# Patient Record
Sex: Female | Born: 1985 | Race: Black or African American | Hispanic: No | Marital: Single | Smoking: Never smoker
Health system: Southern US, Community
[De-identification: ages and names within clinical notes are randomized; demographics above are authoritative.]

---

## 2008-07-14 ENCOUNTER — Inpatient Hospital Stay (HOSPITAL_COMMUNITY): Admission: AD | Admit: 2008-07-14 | Discharge: 2008-07-14 | Payer: Self-pay | Admitting: Obstetrics and Gynecology

## 2008-07-14 ENCOUNTER — Inpatient Hospital Stay (HOSPITAL_COMMUNITY): Admission: AD | Admit: 2008-07-14 | Discharge: 2008-07-18 | Payer: Self-pay | Admitting: Obstetrics and Gynecology

## 2008-07-15 ENCOUNTER — Encounter (INDEPENDENT_AMBULATORY_CARE_PROVIDER_SITE_OTHER): Payer: Self-pay | Admitting: Obstetrics and Gynecology

## 2008-07-15 DIAGNOSIS — O4290 Premature rupture of membranes, unspecified as to length of time between rupture and onset of labor, unspecified weeks of gestation: Secondary | ICD-10-CM

## 2009-05-23 ENCOUNTER — Emergency Department (HOSPITAL_COMMUNITY): Admission: EM | Admit: 2009-05-23 | Discharge: 2009-05-23 | Payer: Self-pay | Admitting: Emergency Medicine

## 2009-05-31 ENCOUNTER — Emergency Department (HOSPITAL_COMMUNITY): Admission: EM | Admit: 2009-05-31 | Discharge: 2009-06-01 | Payer: Self-pay | Admitting: Emergency Medicine

## 2010-05-03 ENCOUNTER — Emergency Department (HOSPITAL_COMMUNITY): Admission: EM | Admit: 2010-05-03 | Discharge: 2010-05-03 | Payer: Self-pay | Admitting: Family Medicine

## 2011-03-06 LAB — POCT PREGNANCY, URINE: Preg Test, Ur: NEGATIVE

## 2011-04-11 NOTE — Op Note (Signed)
NAME:  Krista Rice, Krista Rice NO.:  [id number]   MEDICAL RECORD NO.:  [id number]          PATIENT TYPE:  INP   LOCATION:  9141                          FACILITY:  [facility]   PHYSICIAN:  Sherron Monday, MD        DATE OF BIRTH:  Apr 21, 1986   DATE OF PROCEDURE:  07/15/2008  DATE OF DISCHARGE:                               OPERATIVE REPORT   PREOPERATIVE DIAGNOSIS:  Completely dilated, breech presentation.   POSTOPERATIVE DIAGNOSES:  Completely dilated, breech presentation, and  delivered via low-transverse cesarean section.   PROCEDURE:  Primary low-transverse cesarean section.   SURGEON:  Sherron Monday, MD   ANESTHESIA:  Epidural with 20 mL of 1% lidocaine local anesthetic.   FINDINGS:  Viable female infant at 12:34 a.m. with Apgars of 8 at 1  minute and 9 at 5 minutes and a weight of 5 pounds.  Placenta was  expressed intact.  Normal uterus, tubes, and ovaries noted.  Cord pH of  7.18.   COMPLICATIONS:  None.   PATHOLOGY:  None.   ESTIMATED BLOOD LOSS:  700 mL   IV FLUIDS:  2500 mL.   URINE OUTPUT:  25 mL clear urine at the end of the procedure.   DISPOSITION:  Stable to PACU.   DESCRIPTION OF PROCEDURE:  There was a 5-minute bradycardia of 50-60  beats per minute for fetal heart rate.  Directly following this,  complete dilitation was verified and pushing began, meconium was noted  and a breech presentation.  At this point, decision was made to proceed  with a stat cesarean section.  I spoke with the patient briefly risks,  benefits, and alternatives of a low-transverse cesarean section and  proceeded to the operating room where she was placed on the table in  supine position with a leftward tilt and her epidural was dosed and  found to be adequate.  She was then prepped and draped in the normal  sterile fashion.  A Pfannenstiel skin incision was made approximately  two fingerbreadths above the pubic symphysis carried through the  underlying layer of fascia  sharply.  The fascia was incised in the  midline and the incision was extended laterally with Mayo scissors.  The  inferior aspect of the fascial incision was grasped with Kocher clamps,  elevating the rectus muscles were dissected off both bluntly and  sharply.  Superior aspect of the fascial incision was grasped with  Kocher clamps and elevating the rectus muscles were dissected off  bluntly.  Midline was easily identified and the peritoneum was entered  bluntly.  The Alexis skin retractor was placed and carefully checked to  make sure that no bowel was entrapped.  The vesicouterine peritoneum was  identified and transverse incision was made on the uterus, and the  infant was delivered from a breech presentation.  The uterus was cleared  of all clots and debris.  The uterine incision was closed with 2 layers  of 0 Monocryl, the first one which is a running locked and the second is  an imbricating.  Irrigation was performed of  the pelvis and hemostasis  of the incision was assured.  The subfascial planes were inspected and  found to be hemostatic.  The fascia was then closed with 0 Vicryl in a  running fashion, subcuticular adipose layer was made hemostatic with  Bovie cautery.  Irrigation was performed and the tissue was  reapproximated using 2-0 plain gut.  The skin was closed with staples.  The patient tolerated the procedure well.  Sponge, lap, and needle  counts were correct x2 at the end of the procedure.  She was taken in  stable condition to the PACU.      Sherron Monday, MD  Electronically Signed     JB/MEDQ  D:  07/15/2008  T:  07/15/2008  Job:  [id number]
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
## 2018-09-25 ENCOUNTER — Ambulatory Visit: Payer: Self-pay | Admitting: Family Medicine

## 2018-09-25 VITALS — BP 124/74 | HR 89 | Temp 98.0°F | Resp 14 | Ht 70.0 in | Wt 313.0 lb

## 2018-09-25 DIAGNOSIS — R208 Other disturbances of skin sensation: Secondary | ICD-10-CM

## 2018-09-25 DIAGNOSIS — R202 Paresthesia of skin: Secondary | ICD-10-CM

## 2018-09-25 NOTE — Progress Notes (Signed)
Subjective: right 4th finger decreased sensation      Krista Rice is a 32 y.o. female who presents with hand paresthesias. Onset of the symptoms was 2 days ago.  For the last 2 days she has had tingling in the distal fingers of her right hand, which has transitioned into decreased sensation in her distal fourth right finger only now.  Tingling resolved.  Denies any other symptoms or concerns.  Aggravating factors: none known. Symptoms have gradually improved. Evaluation to date: none. Treatment to date: none.  Denies any medical history, trauma, injury, or surgery anywhere.  Denies neck pain, back pain, right shoulder pain, arm pain, elbow pain, wrist pain, hand pain, or pain anywhere.  Reports normal range of motion, strength, and motor function everywhere.  Denies any symptoms of neurovascular abnormality.  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  Patient reports recently having a physical with her primary care provider, where her lab work was normal and negative for diabetes.  Patient denies any other symptoms of numbness, tingling, or paresthesias.  Denies neurologic symptoms.  Denies any other symptoms or concerns.    Review of Systems Pertinent items noted in HPI and remainder of comprehensive ROS otherwise negative.    Objective:    BP 124/74 (BP Location: Left Arm)   Pulse 89   Temp 98 F (36.7 C) (Oral)   Resp 14   Ht 5\' 10"  (1.778 m)   Wt (!) 313 lb (142 kg)   LMP 09/12/2018 (Within Days)   SpO2 99%   BMI 44.91 kg/m  Right wrist:  Neurovascular exam is normal. No thenar or hypothenar atrophy. Phalen's: positive. Sensation diminished on the distal aspect of 4th finger, beyond DIP only. Sensation otherwise normal everywhere else. Strength normal. Swelling is not present. Synovitis is not present. Tenderness is present. Tinel's sign: positive. Wrist/hand has normal range of motion and motor function.  No erythema, edema, ecchymosis, warmth to the touch, crepitus, or  deformity noted.  Normal to inspection and palpation.  Left wrist:  normal   General appearance: alert, cooperative, appears stated age and no distress Neck: Normal range of motion, motor function, strength, and sensation.  No TTP noted.  No midline tenderness or deformity.  Normal alignment and curvature.  Normal to inspection and palpation.  Extremities: extremities normal, atraumatic, no cyanosis or edema.  Normal exam of right shoulder, arm, elbow, and forearm. Pulses: 2+ and symmetric Skin: Skin color, texture, turgor normal. No rashes or lesions   Assessment:   Decreased sensation to right fourth finger- possibly very early carpal tunnel syndrome, unclear at this point given the short duration of symptoms.   Plan:     Discussed the differential diagnosis with the patient and all questions answered.   Provided patient with written instructions to take an OTC NSAID PRN.  Discussed the safest way to take medication is for the shortest duration and at lowest dose needed.  Discussed side/adverse effects.  Patient denies any contraindications to NSAID use.  Advised her to take with food and drink an adequate amount of water. Discussed the appropriate application of ice/heat.  Encouraged range of motion exercises and demonstrated these.  Discussed proper ergonomics.  Did not have the appropriate splint in the office that fit the patient, provided her with instructions on the appropriate splint to buy over-the-counter.  Applied an Ace wrap in the meantime.   Discussed that this treatment is a trial to see if it improves/resolves her symptoms  but that there is uncertainty of the diagnosis given short duration of symptoms/improving symptoms. Advised patient that if her symptoms do not improve in the next 2-3 weeks, worsen, or she develops any new symptoms that she needs to follow-up with her primary care provider. Red flag symptoms and indications to seek medical care discussed.

## 2019-01-13 NOTE — Patient Instructions (Addendum)
Nexplanon Instructions After Insertion   Keep bandage clean and dry for 24 hours   May use ice/Tylenol/Ibuprofen for soreness or pain   If you develop fever, drainage or increased warmth from incision site-contact office immediately    Preventive Care 18-39 Years, Female Preventive care refers to lifestyle choices and visits with your health care provider that can promote health and wellness. What does preventive care include?   A yearly physical exam. This is also called an annual well check.  Dental exams once or twice a year.  Routine eye exams. Ask your health care provider how often you should have your eyes checked.  Personal lifestyle choices, including: ? Daily care of your teeth and gums. ? Regular physical activity. ? Eating a healthy diet. ? Avoiding tobacco and drug use. ? Limiting alcohol use. ? Practicing safe sex. ? Taking vitamin and mineral supplements as recommended by your health care provider. What happens during an annual well check? The services and screenings done by your health care provider during your annual well check will depend on your age, overall health, lifestyle risk factors, and family history of disease. Counseling Your health care provider may ask you questions about your:  Alcohol use.  Tobacco use.  Drug use.  Emotional well-being.  Home and relationship well-being.  Sexual activity.  Eating habits.  Work and work environment.  Method of birth control.  Menstrual cycle.  Pregnancy history. Screening You may have the following tests or measurements:  Height, weight, and BMI.  Diabetes screening. This is done by checking your blood sugar (glucose) after you have not eaten for a while (fasting).  Blood pressure.  Lipid and cholesterol levels. These may be checked every 5 years starting at age 20.  Skin check.  Hepatitis C blood test.  Hepatitis B blood test.  Sexually transmitted disease (STD)  testing.  BRCA-related cancer screening. This may be done if you have a family history of breast, ovarian, tubal, or peritoneal cancers.  Pelvic exam and Pap test. This may be done every 3 years starting at age 21. Starting at age 30, this may be done every 5 years if you have a Pap test in combination with an HPV test. Discuss your test results, treatment options, and if necessary, the need for more tests with your health care provider. Vaccines Your health care provider may recommend certain vaccines, such as:  Influenza vaccine. This is recommended every year.  Tetanus, diphtheria, and acellular pertussis (Tdap, Td) vaccine. You may need a Td booster every 10 years.  Varicella vaccine. You may need this if you have not been vaccinated.  HPV vaccine. If you are 26 or younger, you may need three doses over 6 months.  Measles, mumps, and rubella (MMR) vaccine. You may need at least one dose of MMR. You may also need a second dose.  Pneumococcal 13-valent conjugate (PCV13) vaccine. You may need this if you have certain conditions and were not previously vaccinated.  Pneumococcal polysaccharide (PPSV23) vaccine. You may need one or two doses if you smoke cigarettes or if you have certain conditions.  Meningococcal vaccine. One dose is recommended if you are age 19-21 years and a first-year college student living in a residence hall, or if you have one of several medical conditions. You may also need additional booster doses.  Hepatitis A vaccine. You may need this if you have certain conditions or if you travel or work in places where you may be exposed to hepatitis A.    Hepatitis B vaccine. You may need this if you have certain conditions or if you travel or work in places where you may be exposed to hepatitis B.  Haemophilus influenzae type b (Hib) vaccine. You may need this if you have certain risk factors. Talk to your health care provider about which screenings and vaccines you need  and how often you need them. This information is not intended to replace advice given to you by your health care provider. Make sure you discuss any questions you have with your health care provider. Document Released: 01/09/2002 Document Revised: 06/26/2017 Document Reviewed: 09/14/2015 Elsevier Interactive Patient Education  2019 Elsevier Inc.  

## 2019-01-14 ENCOUNTER — Encounter: Payer: Self-pay | Admitting: Certified Nurse Midwife

## 2019-01-14 ENCOUNTER — Ambulatory Visit (INDEPENDENT_AMBULATORY_CARE_PROVIDER_SITE_OTHER): Payer: Managed Care, Other (non HMO) | Admitting: Certified Nurse Midwife

## 2019-01-14 VITALS — BP 124/97 | HR 79 | Ht 70.0 in | Wt 319.5 lb

## 2019-01-14 DIAGNOSIS — Z3049 Encounter for surveillance of other contraceptives: Secondary | ICD-10-CM

## 2019-01-14 DIAGNOSIS — Z3046 Encounter for surveillance of implantable subdermal contraceptive: Secondary | ICD-10-CM

## 2019-01-14 DIAGNOSIS — Z30017 Encounter for initial prescription of implantable subdermal contraceptive: Secondary | ICD-10-CM

## 2019-01-14 NOTE — Progress Notes (Signed)
Krista Rice is a 33 y.o. year old G17P0101 African American female here for Nexplanon removal and reinsertion.  She was given informed consent for removal and reinsertion of her Nexplanon. Her Nexplanon was placed in February 2017.   Risks/benefits/side effects of Nexplanon have been discussed and her questions have been answered.  Specifically, a failure rate of 11/998 has been reported, with an increased failure rate if pt takes St. John's Wort and/or antiseizure medicaitons.  [name] is aware of the common side effect of irregular bleeding, which the incidence of decreases over time.  BP (!) 124/97   Pulse 79   Ht 5\' 10"  (1.778 m)   Wt (!) 319 lb 8 oz (144.9 kg)   LMP 12/16/2018 (Within Days)   BMI 45.84 kg/m   Appropriate time out taken. Nexplanon site identified.  Area prepped in usual sterile fashon. Two cc's of 2% lidocaine was used to anesthetize the area. A small stab incision was made right beside the implant on the distal portion.  The Nexplanon rod was grasped using hemostats and removed intact without difficulty.  The area was cleansed again with betadine and the Nexplanon was inserted per manufacturer's recommendations without difficulty.  Steri-strips and a pressure bandage was applied.  There was less than 3 cc blood loss. There were no complications.  The patient tolerated the procedure well.  She was instructed to keep the area clean and dry, remove pressure bandage in 24 hours, and keep insertion site covered with the steri-strips for 3-5 days.  She was given a card indicating date Nexplanon was inserted and date it needs to be removed.   Reviewed red flag symptoms and when to call.   RTC x 4 months for ANNUAL EXAM or sooner if needed.    Gunnar Bulla, CNM [facility], [facility] 01/14/19 9:23 AM   NDC: 9379-0240-97 Exp: 04/2021 Lot: [id number]

## 2019-01-14 NOTE — Progress Notes (Signed)
Pt is present today for nexplanon removal and insertion. Pt stated that she is doing well. No problems or concerns to report.

## 2019-06-27 ENCOUNTER — Ambulatory Visit: Payer: Managed Care, Other (non HMO) | Admitting: Adult Health

## 2019-06-27 ENCOUNTER — Other Ambulatory Visit: Payer: Self-pay

## 2019-06-27 ENCOUNTER — Encounter: Payer: Self-pay | Admitting: Adult Health

## 2019-06-27 VITALS — BP 130/86 | HR 93 | Temp 98.0°F | Resp 18 | Ht 71.0 in | Wt 339.0 lb

## 2019-06-27 DIAGNOSIS — Z008 Encounter for other general examination: Secondary | ICD-10-CM

## 2019-06-27 DIAGNOSIS — Z0189 Encounter for other specified special examinations: Secondary | ICD-10-CM | POA: Diagnosis not present

## 2019-06-27 DIAGNOSIS — Z6841 Body Mass Index (BMI) 40.0 and over, adult: Secondary | ICD-10-CM

## 2019-06-27 NOTE — Progress Notes (Signed)
[facility]  Krista Rice DOB: 33 y.o. MRN: [id number]  Subjective:  Here for Biometric Screen/brief exam Patient is a 33 year old female in no acute distress who comes to the clinic for a brief biometric exam and screening.   She reports she use to see a nutritionist in [place] and no longer does. She feels she did better with her weight when she received nutritional counseling.  She reports she has gained weight and that she is not eating good foods. She desires to see nutritional services again.  Denies leg swelling or cramping.  She reports she sees a primary care provider in [place] for yearly physical and female health maintenance.  Patient  denies any fever, body aches,chills, rash, chest pain, shortness of breath, nausea, vomiting, or diarrhea.   Objective: Blood pressure 130/86, pulse 93, temperature 98 F (36.7 C), temperature source Temporal, resp. rate 18, height 5\' 11"  (1.803 m), weight (!) 339 lb (153.8 kg), SpO2 98 %. Body mass index is 47.28 kg/m. NAD, obese, well nourished, well developed  HEENT: Within normal limits Neck: Normal Heart: Regular rate and rhythm Lungs: Clear to auscultation without adventitious lung sounds.   Assessment: Biometric screen 1. Severe obesity (BMI >= 40) (HCC)   2. Encounter for biometric screening   3. Encounter for other general examination- brief biometric exam with biometric screening    4. Class 3 severe obesity due to excess calories with body mass index (BMI) of 45.0 to 49.9 in adult, unspecified whether serious comorbidity present [facility])      Plan: Orders Placed This Encounter  Procedures  . Ambulatory referral to Nutrition and Diabetic Education   Call if you have not heard from nutritional services within one week.  Fasting glucose and lipids. Discussed with patient that today's visit here is a limited biometric screening  visit (not a comprehensive exam or management of any chronic problems) Discussed some health issues, including healthy eating habits and exercise. Encouraged to follow-up with PCP for annual comprehensive preventive and wellness care (and if applicable, any chronic issues). Questions invited and answered.   Follow up with primary care as needed for chronic and maintenance health care- can be seen in this employee clinic for acute care.

## 2019-06-27 NOTE — Patient Instructions (Signed)
The Biometric exam is a brief physical with labs including glucose and cholesterol. This does not replace a full physical with a primary care provider, and additional recommended labs. This is an acute care clinic not for maintenance of chronic or long standing conditions.   Provider also recommends if you do not have a primary care provider for patient to establish care promptly.You can choose any provider of your choice at any facility of your choice, the below information is  just a resource to aid in you finding a primary care provider for routine health maintenance.   [facility]  PHYSICIAN/PROVIDER  REFERRAL LINE at [phone]  [email or web address] to help assist with finding a primary care doctor.   Helpful resources below of other primary care office's accepting new patients.   Lovie Macadamia. [facility]         [address]  [place]. [place] [address] [phone]  . [facility]    [address], [address] [place], [place] [address] [phone]  . [facility] [address]  [place], [place]  [phone]   . [facility]  [address], [address] [place] [address] [phone]    Follow up with primary care as needed for chronic and maintenance health care- can be seen in this employee clinic for acute care.     Heart-Healthy Eating Plan Heart-healthy meal planning includes:  Eating less unhealthy fats.  Eating more healthy fats.  Making other changes in your diet. Talk with your doctor or a diet specialist (dietitian) to create an eating plan that is right for you. What is my plan? Your doctor may recommend an eating plan that includes:  Total fat: ______% or less of total calories a day.  Saturated fat: ______% or less of total calories a day.  Cholesterol: less than _________mg a day. What are tips for following this plan? Cooking Avoid frying your  food. Try to bake, boil, grill, or broil it instead. You can also reduce fat by:  Removing the skin from poultry.  Removing all visible fats from meats.  Steaming vegetables in water or broth. Meal planning   At meals, divide your plate into four equal parts: ? Fill one-half of your plate with vegetables and green salads. ? Fill one-fourth of your plate with whole grains. ? Fill one-fourth of your plate with lean protein foods.  Eat 4-5 servings of vegetables per day. A serving of vegetables is: ? 1 cup of raw or cooked vegetables. ? 2 cups of raw leafy greens.  Eat 4-5 servings of fruit per day. A serving of fruit is: ? 1 medium whole fruit. ?  cup of dried fruit. ?  cup of fresh, frozen, or canned fruit. ?  cup of 100% fruit juice.  Eat more foods that have soluble fiber. These are apples, broccoli, carrots, beans, peas, and barley. Try to get 20-30 g of fiber per day.  Eat 4-5 servings of nuts, legumes, and seeds per week: ? 1 serving of dried beans or legumes equals  cup after being cooked. ? 1 serving of nuts is  cup. ? 1 serving of seeds equals 1 tablespoon. General information  Eat more home-cooked food. Eat less restaurant, buffet, and fast food.  Limit or avoid alcohol.  Limit foods that are high in starch and sugar.  Avoid fried foods.  Lose weight if you are overweight.  Keep track of how  much salt (sodium) you eat. This is important if you have high blood pressure. Ask your doctor to tell you more about this.  Try to add vegetarian meals each week. Fats  Choose healthy fats. These include olive oil and canola oil, flaxseeds, walnuts, almonds, and seeds.  Eat more omega-3 fats. These include salmon, mackerel, sardines, tuna, flaxseed oil, and ground flaxseeds. Try to eat fish at least 2 times each week.  Check food labels. Avoid foods with trans fats or high amounts of saturated fat.  Limit saturated fats. ? These are often found in animal  products, such as meats, butter, and cream. ? These are also found in plant foods, such as palm oil, palm kernel oil, and coconut oil.  Avoid foods with partially hydrogenated oils in them. These have trans fats. Examples are stick margarine, some tub margarines, cookies, crackers, and other baked goods. What foods can I eat? Fruits All fresh, canned (in natural juice), or frozen fruits. Vegetables Fresh or frozen vegetables (raw, steamed, roasted, or grilled). Green salads. Grains Most grains. Choose whole wheat and whole grains most of the time. [place] and pasta, including brown [place] and pastas made with whole wheat. Meats and other proteins Lean, well-trimmed beef, veal, pork, and lamb. Chicken and [place] without skin. All fish and shellfish. Wild duck, rabbit, pheasant, and venison. Egg whites or low-cholesterol egg substitutes. Dried beans, peas, lentils, and tofu. Seeds and most nuts. Dairy Low-fat or nonfat cheeses, including ricotta and mozzarella. Skim or 1% milk that is liquid, powdered, or evaporated. Buttermilk that is made with low-fat milk. Nonfat or low-fat yogurt. Fats and oils Non-hydrogenated (trans-free) margarines. Vegetable oils, including soybean, sesame, sunflower, olive, peanut, safflower, corn, canola, and cottonseed. Salad dressings or mayonnaise made with a vegetable oil. Beverages Mineral water. Coffee and tea. Diet carbonated beverages. Sweets and desserts Sherbet, gelatin, and fruit ice. Small amounts of dark chocolate. Limit all sweets and desserts. Seasonings and condiments All seasonings and condiments. The items listed above may not be a complete list of foods and drinks you can eat. Contact a dietitian for more options. What foods should I avoid? Fruits Canned fruit in heavy syrup. Fruit in cream or butter sauce. Fried fruit. Limit coconut. Vegetables Vegetables cooked in cheese, cream, or butter sauce. Fried vegetables. Grains Breads that are made with  saturated or trans fats, oils, or whole milk. Croissants. Sweet rolls. Donuts. High-fat crackers, such as cheese crackers. Meats and other proteins Fatty meats, such as hot dogs, ribs, sausage, bacon, rib-eye roast or steak. High-fat deli meats, such as salami and bologna. Caviar. Domestic duck and goose. Organ meats, such as liver. Dairy Cream, sour cream, cream cheese, and creamed cottage cheese. Whole-milk cheeses. Whole or 2% milk that is liquid, evaporated, or condensed. Whole buttermilk. Cream sauce or high-fat cheese sauce. Yogurt that is made from whole milk. Fats and oils Meat fat, or shortening. Cocoa butter, hydrogenated oils, palm oil, coconut oil, palm kernel oil. Solid fats and shortenings, including bacon fat, salt pork, lard, and butter. Nondairy cream substitutes. Salad dressings with cheese or sour cream. Beverages Regular sodas and juice drinks with added sugar. Sweets and desserts Frosting. Pudding. Cookies. Cakes. Pies. Milk chocolate or white chocolate. Buttered syrups. Full-fat ice cream or ice cream drinks. The items listed above may not be a complete list of foods and drinks to avoid. Contact a dietitian for more information. Summary  Heart-healthy meal planning includes eating less unhealthy fats, eating more healthy fats, and making other  changes in your diet.  Eat a balanced diet. This includes fruits and vegetables, low-fat or nonfat dairy, lean protein, nuts and legumes, whole grains, and heart-healthy oils and fats. This information is not intended to replace advice given to you by your health care provider. Make sure you discuss any questions you have with your health care provider. Document Released: 05/14/2012 Document Revised: 01/17/2018 Document Reviewed: 12/21/2017 Elsevier Patient Education  2020 [place] Maintenance, Female Adopting a healthy lifestyle and getting preventive care are important in promoting health and wellness. Ask your health  care provider about:  The right schedule for you to have regular tests and exams.  Things you can do on your own to prevent diseases and keep yourself healthy. What should I know about diet, weight, and exercise? Eat a healthy diet   Eat a diet that includes plenty of vegetables, fruits, low-fat dairy products, and lean protein.  Do not eat a lot of foods that are high in solid fats, added sugars, or sodium. Maintain a healthy weight Body mass index (BMI) is used to identify weight problems. It estimates body fat based on height and weight. Your health care provider can help determine your BMI and help you achieve or maintain a healthy weight. Get regular exercise Get regular exercise. This is one of the most important things you can do for your health. Most adults should:  Exercise for at least 150 minutes each week. The exercise should increase your heart rate and make you sweat (moderate-intensity exercise).  Do strengthening exercises at least twice a week. This is in addition to the moderate-intensity exercise.  Spend less time sitting. Even light physical activity can be beneficial. Watch cholesterol and blood lipids Have your blood tested for lipids and cholesterol at 32 years of age, then have this test every 5 years. Have your cholesterol levels checked more often if:  Your lipid or cholesterol levels are high.  You are older than 32 years of age.  You are at high risk for heart disease. What should I know about cancer screening? Depending on your health history and family history, you may need to have cancer screening at various ages. This may include screening for:  Breast cancer.  Cervical cancer.  Colorectal cancer.  Skin cancer.  Lung cancer. What should I know about heart disease, diabetes, and high blood pressure? Blood pressure and heart disease  High blood pressure causes heart disease and increases the risk of stroke. This is more likely to develop in  people who have high blood pressure readings, are of African descent, or are overweight.  Have your blood pressure checked: ? Every 3-5 years if you are 67-61 years of age. ? Every year if you are 29 years old or older. Diabetes Have regular diabetes screenings. This checks your fasting blood sugar level. Have the screening done:  Once every three years after age 7 if you are at a normal weight and have a low risk for diabetes.  More often and at a younger age if you are overweight or have a high risk for diabetes. What should I know about preventing infection? Hepatitis B If you have a higher risk for hepatitis B, you should be screened for this virus. Talk with your health care provider to find out if you are at risk for hepatitis B infection. Hepatitis C Testing is recommended for:  Everyone born from [birth year] through 1964.  Anyone with known risk factors for hepatitis C. Sexually transmitted infections (STIs)  Get screened for STIs, including gonorrhea and chlamydia, if: ? You are sexually active and are younger than 32 years of age. ? You are older than 32 years of age and your health care provider tells you that you are at risk for this type of infection. ? Your sexual activity has changed since you were last screened, and you are at increased risk for chlamydia or gonorrhea. Ask your health care provider if you are at risk.  Ask your health care provider about whether you are at high risk for HIV. Your health care provider may recommend a prescription medicine to help prevent HIV infection. If you choose to take medicine to prevent HIV, you should first get tested for HIV. You should then be tested every 3 months for as long as you are taking the medicine. Pregnancy  If you are about to stop having your period (premenopausal) and you may become pregnant, seek counseling before you get pregnant.  Take 400 to 800 micrograms (mcg) of folic acid every day if you become pregnant.   Ask for birth control (contraception) if you want to prevent pregnancy. Osteoporosis and menopause Osteoporosis is a disease in which the bones lose minerals and strength with aging. This can result in bone fractures. If you are 32 years old or older, or if you are at risk for osteoporosis and fractures, ask your health care provider if you should:  Be screened for bone loss.  Take a calcium or vitamin D supplement to lower your risk of fractures.  Be given hormone replacement therapy (HRT) to treat symptoms of menopause. Follow these instructions at home: Lifestyle  Do not use any products that contain nicotine or tobacco, such as cigarettes, e-cigarettes, and chewing tobacco. If you need help quitting, ask your health care provider.  Do not use street drugs.  Do not share needles.  Ask your health care provider for help if you need support or information about quitting drugs. Alcohol use  Do not drink alcohol if: ? Your health care provider tells you not to drink. ? You are pregnant, may be pregnant, or are planning to become pregnant.  If you drink alcohol: ? Limit how much you use to 0-1 drink a day. ? Limit intake if you are breastfeeding.  Be aware of how much alcohol is in your drink. In the [place] one drink equals one 12 oz bottle of beer (355 mL), one 5 oz glass of wine (148 mL), or one 1 oz glass of hard liquor (44 mL). General instructions  Schedule regular health, dental, and eye exams.  Stay current with your vaccines.  Tell your health care provider if: ? You often feel depressed. ? You have ever been abused or do not feel safe at home. Summary  Adopting a healthy lifestyle and getting preventive care are important in promoting health and wellness.  Follow your health care provider's instructions about healthy diet, exercising, and getting tested or screened for diseases.  Follow your health care provider's instructions on monitoring your cholesterol and  blood pressure. This information is not intended to replace advice given to you by your health care provider. Make sure you discuss any questions you have with your health care provider. Document Released: 05/29/2011 Document Revised: 11/06/2018 Document Reviewed: 11/06/2018 Elsevier Patient Education  2020 Elsevier Inc[employer] Preventing Health Risks of Being Overweight Maintaining a healthy body weight is an important part of your overall health. Your healthy body weight depends on your age, gender, and height. Being  overweight puts you at risk for many health problems, including:  Heart disease.  Diabetes.  Problems sleeping.  Joint problems. You can make changes to your diet and lifestyle to prevent these risks. Consider working with a health care provider or a dietitian to make these changes. What nutrition changes can be made?   Eat only as much as your body needs. In most cases, this is about 2,000 calories a day, but the amount varies depending on your height, gender, and activity level. Ask your health care provider how many calories you should have each day. Eating more than your body needs on a regular basis can cause you to become overweight or obese.  Eat slowly, and stop eating when you feel full.  Choose healthy foods, including: ? Fruits and vegetables. ? Lean meats. ? Low-fat dairy products. ? High-fiber foods, such as whole grains and beans. ? Healthy snacks like vegetable sticks, a piece of fruit, or a small amount of yogurt or cheese.  Avoid foods and drinks that are high in sugar, salt (sodium), saturated fat, or trans fat. This includes: ? Many desserts such as candy, cookies, and ice cream. ? Soda. ? Fried foods. ? Processed meats such as hot dogs or lunch meats. ? Prepackaged snack foods. What lifestyle changes can be made?   Exercise for at least 150 minutes a week to prevent weight gain, or as often as recommended by your health care provider. Do  moderate-intensity exercise, such as brisk walking. ? Spread it out by exercising for 30 minutes 5 days a week, or in short 10-minute bursts several times a day.  Find other ways to stay active and burn calories, such as yard work or a hobby that involves physical activity.  Get at least 8 hours of sleep each night. When you are well-rested, you are more likely to be active and make healthy choices during the day. To sleep better: ? Try to go to bed and wake up at about the same time every day. ? Keep your bedroom dark, quiet, and cool. ? Make sure that your bed is comfortable. ? Avoid stimulating activities, such as watching television or exercising, for at least one hour before bedtime. Why are these changes important? Eating healthy and being active helps you lose weight and prevent health problems caused by being overweight. Making these changes can also help you manage stress, feel better mentally, and connect with friends and family. What can happen if changes are not made? Being overweight can affect you for your entire life. You may develop joint or bone problems that make it painful or difficult for you to play sports or do activities you enjoy. Being overweight puts stress on your heart and lungs and can lead to medical problems like diabetes, heart disease, and sleeping problems. Where to find support You can get support for preventing health risks of being overweight from:  Your health care provider or a dietitian. They can provide guidance about healthy eating and healthy lifestyle choices.  Weight loss support groups, online or in-person. Where to find more information  MyPlate: https://ball-collins.biz/www.choosemyplate.gov ? This an online tool that provides personalized recommendations about foods to eat each day.  The Centers for Disease Control and Prevention: AffordableScrapbook.glwww.cdc.gov/healthyweight ? This resource gives tips for managing weight and having an active lifestyle. Summary  To prevent unhealthy  weight gain, it is important to maintain a healthy diet high in vegetables and whole grains, exercise regularly, and get at least 8 hours of  sleep each night.  Making these changes helps prevent many long-term (chronic) health conditions that can shorten your life, such as diabetes, heart disease, and stroke. This information is not intended to replace advice given to you by your health care provider. Make sure you discuss any questions you have with your health care provider. Document Released: 10/10/2017 Document Revised: 11/16/2017 Document Reviewed: 10/10/2017 Elsevier Patient Education  2020 [employer].

## 2019-06-28 LAB — LIPID PANEL WITH LDL/HDL RATIO
Cholesterol, Total: 172 mg/dL (ref 100–199)
HDL: 44 mg/dL (ref >39)
LDL Calculated: 119 mg/dL — ABNORMAL HIGH (ref 0–99)
LDl/HDL Ratio: 2.7 ratio (ref 0.0–3.2)
Triglycerides: 47 mg/dL (ref 0–149)
VLDL Cholesterol Cal: 9 mg/dL (ref 5–40)

## 2019-06-28 LAB — GLUCOSE, RANDOM: Glucose: 95 mg/dL (ref 65–99)

## 2019-06-30 ENCOUNTER — Encounter: Payer: Self-pay | Admitting: Adult Health

## 2019-06-30 NOTE — Progress Notes (Signed)
Your glucose is normal at 95. Your cholesterol is normal except your LDL is 119 this is your bad cholesterol and normal range is (0-99). I recommend a low cholesterol, low saturated fat, and increased exercise.  Thanks, Laverna Peace MSN, AGNP-C, FNP-C

## 2019-07-08 ENCOUNTER — Encounter: Payer: Self-pay | Admitting: Adult Health

## 2019-07-08 ENCOUNTER — Other Ambulatory Visit: Payer: Self-pay

## 2019-07-08 ENCOUNTER — Ambulatory Visit: Payer: Managed Care, Other (non HMO) | Admitting: Adult Health

## 2019-07-08 DIAGNOSIS — Z20822 Contact with and (suspected) exposure to covid-19: Secondary | ICD-10-CM

## 2019-07-08 DIAGNOSIS — Z7189 Other specified counseling: Secondary | ICD-10-CM

## 2019-07-08 DIAGNOSIS — J029 Acute pharyngitis, unspecified: Secondary | ICD-10-CM

## 2019-07-08 DIAGNOSIS — R059 Cough, unspecified: Secondary | ICD-10-CM

## 2019-07-08 DIAGNOSIS — R6889 Other general symptoms and signs: Secondary | ICD-10-CM | POA: Diagnosis not present

## 2019-07-08 DIAGNOSIS — R05 Cough: Secondary | ICD-10-CM | POA: Diagnosis not present

## 2019-07-08 NOTE — Progress Notes (Signed)
Virtual Visit via Telephone Note  I connected with Krista Rice on 07/08/19 at  9:00 AM EDT by telephone and verified that I am speaking with the correct person using two identifiers.  Location: Patient: at home  Provider: [facility]    I discussed the limitations, risks, security and privacy concerns of performing an evaluation and management service by telephone and the availability of in person appointments. I also discussed with the patient that there may be a patient responsible charge related to this service. The patient expressed understanding and agreed to proceed.  History of Present Illness: Patient is a 33 year old female in no acute distress who calls for a telephone visit  During Covid 19 pandemic.  Sore throat x 1 day. 2/10 throat pain. " irritating ". Denies any problems swallowing or eating/ drinking.  She has a mild cough x 1 day. She thinks this could be coming from a fan she had blowing on her in her home.   She reports now your boyfriend. She reports her step daughter has 103 fever with same type symptoms.   She works in [employer], she goes in and out of homes.  Patient  denies any fever, body aches,chills, rash, chest pain, shortness of breath, nausea, vomiting, or diarrhea.  Denies being immunocompromised and any recent hospitalizations or illness.    Observations/Objective: temperature 98.1 patient reported, no other vitals available.   Patient is alert and oriented and responsive to questions Engages in conversation with provider. Speaks in full sentences without any pauses without any shortness of breath or distress.   Assessment and Plan:  1. Sore throat   2. Cough   3. Suspected Covid-19 Virus Infection   4. Educated About Covid-19 Virus Infection      Follow Up Instructions:  Orders Placed This Encounter  Procedures  . Novel Coronavirus, NAA ([facility])    An After Visit Summary was printed and given to the  patient. Reviewed over the counter treatment of symptoms and salt water gargles with warm water     I discussed the assessment and treatment plan with the patient. The patient was provided an opportunity to ask questions and all were answered. The patient agreed with the plan and demonstrated an understanding of the instructions.   The patient was advised to call back or seek an in-person evaluation if the symptoms worsen or if the condition fails to improve as anticipated.  I provided 15 minutes of non-face-to-face time during this encounter.   Marcille Buffy, FNP

## 2019-07-08 NOTE — Patient Instructions (Signed)
Hello Krista Rice,  You are being placed in the home monitoring program for COVID-19 (commonly known as Coronavirus).  This is because you are suspected to have the virus or are known to have the virus.  If you are unsure which group you fall into call your clinic.    As part of this program, you'll answer a daily questionnaire in the MyChart mobile app. You'll receive a notification through the MyChart app when the questionnaire is available. When you log in to MyChart, you'll see the tasks in your To Do activity.       Clinicians will see any answers that are concerning and take appropriate steps.  If at any point you are having a medical emergency, call 911.  If otherwise concerned call your clinic instead of coming into the clinic or hospital.  To keep from spreading the disease you should: Stay home and limit contact with other people as much as possible.  Wash your hands frequently. Cover your coughs and sneezes with a tissue, and throw used tissues in the trash.   Clean and disinfect frequently touched surfaces and objects.    Take care of yourself by: Staying home Resting Drinking fluids Take fever-reducing medications (Tylenol/Acetaminophen and Ibuprofen)  For more information on the disease go to the Centers for Disease Control and Prevention website   COVID-19 Frequently Asked Questions COVID-19 (coronavirus disease) is an infection that is caused by a large family of viruses. Some viruses cause illness in people and others cause illness in animals like camels, cats, and bats. In some cases, the viruses that cause illness in animals can spread to humans. Where did the coronavirus come from? In December 2019, Armeniahina told the [employer] [facility](WHO) of several cases of lung disease (human respiratory illness). These cases were linked to an open [place] and [place]. The link to the seafood and livestock market suggests that the virus may have spread  from animals to humans. However, since that first outbreak in December, the virus has also been shown to spread from person to person. What is the name of the disease and the virus? Disease name Early on, this disease was called novel coronavirus. This is because scientists determined that the disease was caused by a new (novel) respiratory virus. The World Health Organization [facility](WHO) has now named the disease COVID-19, or coronavirus disease. Virus name The virus that causes the disease is called severe acute respiratory syndrome coronavirus 2 (SARS-CoV-2). More information on disease and virus naming World Health Organization [facility](WHO): www.who.int/emergencies/diseases/novel-coronavirus-2019/technical-guidance/naming-the-coronavirus-disease-(covid-2019)-and-the-virus-that-causes-it Who is at risk for complications from coronavirus disease? Some people may be at higher risk for complications from coronavirus disease. This includes older adults and people who have chronic diseases, such as heart disease, diabetes, and lung disease. If you are at higher risk for complications, take these extra precautions:  Avoid close contact with people who are sick or have a fever or cough. Stay at least 3-6 ft (1-2 m) away from them, if possible.  Wash your hands often with soap and water for at least 20 seconds.  Avoid touching your face, mouth, nose, or eyes.  Keep supplies on hand at home, such as food, medicine, and cleaning supplies.  Stay home as much as possible.  Avoid social gatherings and travel. How does coronavirus disease spread? The virus that causes coronavirus disease spreads easily from person to person (is contagious). There are also cases of community-spread disease. This means the disease has spread to:  People who have no known contact with other infected people.  People who have not traveled to areas where there are known cases. It appears to spread from one person to another through  droplets from coughing or sneezing. Can I get the virus from touching surfaces or objects? There is still a lot that we do not know about the virus that causes coronavirus disease. Scientists are basing a lot of information on what they know about similar viruses, such as:  Viruses cannot generally survive on surfaces for long. They need a human body (host) to survive.  It is more likely that the virus is spread by close contact with people who are sick (direct contact), such as through: ? Shaking hands or hugging. ? Breathing in respiratory droplets that travel through the air. This can happen when an infected person coughs or sneezes on or near other people.  It is less likely that the virus is spread when a person touches a surface or object that has the virus on it (indirect contact). The virus may be able to enter the body if the person touches a surface or object and then touches his or her face, eyes, nose, or mouth. Can a person spread the virus without having symptoms of the disease? It may be possible for the virus to spread before a person has symptoms of the disease, but this is most likely not the main way the virus is spreading. It is more likely for the virus to spread by being in close contact with people who are sick and breathing in the respiratory droplets of a sick person's cough or sneeze. What are the symptoms of coronavirus disease? Symptoms vary from person to person and can range from mild to severe. Symptoms may include:  Fever.  Cough.  Tiredness, weakness, or fatigue.  Fast breathing or feeling short of breath. These symptoms can appear anywhere from 2 to 14 days after you have been exposed to the virus. If you develop symptoms, call your health care provider. People with severe symptoms may need hospital care. If I am exposed to the virus, how long does it take before symptoms start? Symptoms of coronavirus disease may appear anywhere from 2 to 14 days after a  person has been exposed to the virus. If you develop symptoms, call your health care provider. Should I be tested for this virus? Your health care provider will decide whether to test you based on your symptoms, history of exposure, and your risk factors. How does a health care provider test for this virus? Health care providers will collect samples to send for testing. Samples may include:  Taking a swab of fluid from the nose.  Taking fluid from the lungs by having you cough up mucus (sputum) into a sterile cup.  Taking a blood sample.  Taking a stool or urine sample. Is there a treatment or vaccine for this virus? Currently, there is no vaccine to prevent coronavirus disease. Also, there are no medicines like antibiotics or antivirals to treat the virus. A person who becomes sick is given supportive care, which means rest and fluids. A person may also relieve his or her symptoms by using over-the-counter medicines that treat sneezing, coughing, and runny nose. These are the same medicines that a person takes for the common cold. If you develop symptoms, call your health care provider. People with severe symptoms may need hospital care. What can I do to protect myself and my family from this virus?  You can protect yourself and your family by taking the same actions that you would take to prevent the spread of other viruses. Take the following actions:  Wash your hands often with soap and water for at least 20 seconds. If soap and water are not available, use alcohol-based hand sanitizer.  Avoid touching your face, mouth, nose, or eyes.  Cough or sneeze into a tissue, sleeve, or elbow. Do not cough or sneeze into your hand or the air. ? If you cough or sneeze into a tissue, throw it away immediately and wash your hands.  Disinfect objects and surfaces that you frequently touch every day.  Avoid close contact with people who are sick or have a fever or cough. Stay at least 3-6 ft  (1-2 m) away from them, if possible.  Stay home if you are sick, except to get medical care. Call your health care provider before you get medical care.  Make sure your vaccines are up to date. Ask your health care provider what vaccines you need. What should I do if I need to travel? Follow travel recommendations from your local health authority, the CDC, and WHO. Travel information and advice  Centers for Disease Control and Prevention (CDC): GeminiCard.glwww.cdc.gov/coronavirus/2019-ncov/travelers/index.html  World Health Organization [facility](WHO): PreviewDomains.sewww.who.int/emergencies/diseases/novel-coronavirus-2019/travel-advice Know the risks and take action to protect your health  You are at higher risk of getting coronavirus disease if you are traveling to areas with an outbreak or if you are exposed to travelers from areas with an outbreak.  Wash your hands often and practice good hygiene to lower the risk of catching or spreading the virus. What should I do if I am sick? General instructions to stop the spread of infection  Wash your hands often with soap and water for at least 20 seconds. If soap and water are not available, use alcohol-based hand sanitizer.  Cough or sneeze into a tissue, sleeve, or elbow. Do not cough or sneeze into your hand or the air.  If you cough or sneeze into a tissue, throw it away immediately and wash your hands.  Stay home unless you must get medical care. Call your health care provider or local health authority before you get medical care.  Avoid public areas. Do not take public transportation, if possible.  If you can, wear a mask if you must go out of the house or if you are in close contact with someone who is not sick. Keep your home clean  Disinfect objects and surfaces that are frequently touched every day. This may include: ? Counters and tables. ? Doorknobs and light switches. ? Sinks and faucets. ? Electronics such as phones, remote controls, keyboards, computers,  and tablets.  Wash dishes in hot, soapy water or use a dishwasher. Air-dry your dishes.  Wash laundry in hot water. Prevent infecting other household members  Let healthy household members care for children and pets, if possible. If you have to care for children or pets, wash your hands often and wear a mask.  Sleep in a different bedroom or bed, if possible.  Do not share personal items, such as razors, toothbrushes, deodorant, combs, brushes, towels, and washcloths. Where to find more information Centers for Disease Control and Prevention (CDC)  Information and news updates: CardRetirement.czwww.cdc.gov/coronavirus/2019-ncov World Health Organization [facility](WHO)  Information and news updates: AffordableSalon.eswww.who.int/emergencies/diseases/novel-coronavirus-2019  Coronavirus health topic: https://thompson-craig.com/www.who.int/health-topics/coronavirus  Questions and answers on COVID-19: kruiseway.comwww.who.int/news-room/q-a-detail/q-a-coronaviruses  Global tracker: who.sprinklr.com American Academy of Pediatrics (AAP)  Information for families: www.healthychildren.org/English/health-issues/conditions/chest-lungs/Pages/2019-Novel-Coronavirus.aspx The coronavirus situation is  changing rapidly. Check your local health authority website or the CDC and [facility] websites for updates and news. When should I contact a health care provider?  Contact your health care provider if you have symptoms of an infection, such as fever or cough, and you: ? Have been near anyone who is known to have coronavirus disease. ? Have come into contact with a person who is suspected to have coronavirus disease. ? Have traveled outside of the country. When should I get emergency medical care?  Get help right away by calling your local emergency services (911 in the [place] if you have: ? Trouble breathing. ? Pain or pressure in your chest. ? Confusion. ? Blue-tinged lips and fingernails. ? Difficulty waking from sleep. ? Symptoms that get worse. Let the emergency medical personnel  know if you think you have coronavirus disease. Summary  A new respiratory virus is spreading from person to person and causing COVID-19 (coronavirus disease).  The virus that causes COVID-19 appears to spread easily. It spreads from one person to another through droplets from coughing or sneezing.  Older adults and those with chronic diseases are at higher risk of disease. If you are at higher risk for complications, take extra precautions.  There is currently no vaccine to prevent coronavirus disease. There are no medicines, such as antibiotics or antivirals, to treat the virus.  You can protect yourself and your family by washing your hands often, avoiding touching your face, and covering your coughs and sneezes. This information is not intended to replace advice given to you by your health care provider. Make sure you discuss any questions you have with your health care provider. Document Released: 03/11/2019 Document Revised: 03/11/2019 Document Reviewed: 03/11/2019 Elsevier Patient Education  2020 Else[employer] Inc. Sore Throat When you have a sore throat, your throat may feel:  Tender.  Burning.  Irritated.  Scratchy.  Painful when you swallow.  Painful when you talk. Many things can cause a sore throat, such as:  An infection.  Allergies.  Dry air.  Smoke or pollution.  Radiation treatment.  Gastroesophageal reflux disease (GERD).  A tumor. A sore throat can be the first sign of another sickness. It can happen with other problems, like:  Coughing.  Sneezing.  Fever.  Swelling in the neck. Most sore throats go away without treatment. Follow these instructions at home:      Take over-the-counter medicines only as told by your doctor. ? If your child has a sore throat, do not give your child aspirin.  Drink enough fluids to keep your pee (urine) pale yellow.  Rest when you feel you need to.  To help with pain: ? Sip warm liquids, such as broth,  herbal tea, or warm water. ? Eat or drink cold or frozen liquids, such as frozen ice pops. ? Gargle with a salt-water mixture 3-4 times a day or as needed. To make a salt-water mixture, add -1 tsp (3-6 g) of salt to 1 cup (237 mL) of warm water. Mix it until you cannot see the salt anymore. ? Suck on hard candy or throat lozenges. ? Put a cool-mist humidifier in your bedroom at night. ? Sit in the bathroom with the door closed for 5-10 minutes while you run hot water in the shower.  Do not use any products that contain nicotine or tobacco, such as cigarettes, e-cigarettes, and chewing tobacco. If you need help quitting, ask your doctor.  Wash your hands well and often with soap and water.  If soap and water are not available, use hand sanitizer. Contact a doctor if:  You have a fever for more than 2-3 days.  You keep having symptoms for more than 2-3 days.  Your throat does not get better in 7 days.  You have a fever and your symptoms suddenly get worse.  Your child who is 3 months to 22 years old has a temperature of 102.46F (39C) or higher. Get help right away if:  You have trouble breathing.  You cannot swallow fluids, soft foods, or your saliva.  You have swelling in your throat or neck that gets worse.  You keep feeling sick to your stomach (nauseous).  You keep throwing up (vomiting). Summary  A sore throat is pain, burning, irritation, or scratchiness in the throat. Many things can cause a sore throat.  Take over-the-counter medicines only as told by your doctor. Do not give your child aspirin.  Drink plenty of fluids, and rest as needed.  Contact a doctor if your symptoms get worse or your sore throat does not get better within 7 days. This information is not intended to replace advice given to you by your health care provider. Make sure you discuss any questions you have with your health care provider. Document Released: 08/22/2008 Document Revised: 04/15/2018  Document Reviewed: 04/15/2018 Elsevier Patient Education  2020 Elsevier Inc. Cough, Adult A cough helps to clear your throat and lungs. A cough may be a sign of an illness or another medical condition. An acute cough may only last 2-3 weeks, while a chronic cough may last 8 or more weeks. Many things can cause a cough. They include:  Germs (viruses or bacteria) that attack the airway.  Breathing in things that bother (irritate) your lungs.  Allergies.  Asthma.  Mucus that runs down the back of your throat (postnasal drip).  Smoking.  Acid backing up from the stomach into the tube that moves food from the mouth to the stomach (gastroesophageal reflux).  Some medicines.  Lung problems.  Other medical conditions, such as heart failure or a blood clot in the lung (pulmonary embolism). Follow these instructions at home: Medicines  Take over-the-counter and prescription medicines only as told by your doctor.  Talk with your doctor before you take medicines that stop a cough (coughsuppressants). Lifestyle   Do not smoke, and try not to be around smoke. Do not use any products that contain nicotine or tobacco, such as cigarettes, e-cigarettes, and chewing tobacco. If you need help quitting, ask your doctor.  Drink enough fluid to keep your pee (urine) pale yellow.  Avoid caffeine.  Do not drink alcohol if your doctor tells you not to drink. General instructions   Watch for any changes in your cough. Tell your doctor about them.  Always cover your mouth when you cough.  Stay away from things that make you cough, such as perfume, candles, campfire smoke, or cleaning products.  If the air is dry, use a cool mist vaporizer or humidifier in your home.  If your cough is worse at night, try using extra pillows to raise your head up higher while you sleep.  Rest as needed.  Keep all follow-up visits as told by your doctor. This is important. Contact a doctor if:  You have  new symptoms.  You cough up pus.  Your cough does not get better after 2-3 weeks, or your cough gets worse.  Cough medicine does not help your cough and you are not sleeping well.  You have pain that gets worse or pain that is not helped with medicine.  You have a fever.  You are losing weight and you do not know why.  You have night sweats. Get help right away if:  You cough up blood.  You have trouble breathing.  Your heartbeat is very fast. These symptoms may be an emergency. Do not wait to see if the symptoms will go away. Get medical help right away. Call your local emergency services (911 in the [place] Do not drive yourself to the hospital. Summary  A cough helps to clear your throat and lungs. Many things can cause a cough.  Take over-the-counter and prescription medicines only as told by your doctor.  Always cover your mouth when you cough.  Contact a doctor if you have new symptoms or you have a cough that does not get better or gets worse. This information is not intended to replace advice given to you by your health care provider. Make sure you discuss any questions you have with your health care provider. Document Released: 07/27/2011 Document Revised: 12/02/2018 Document Reviewed: 12/02/2018 Elsevier Patient Education  2020 [employer]

## 2019-07-09 LAB — NOVEL CORONAVIRUS, NAA: SARS-CoV-2, NAA: NOT DETECTED

## 2019-07-11 ENCOUNTER — Encounter: Payer: Self-pay | Admitting: Adult Health

## 2019-08-01 ENCOUNTER — Encounter: Payer: Self-pay | Admitting: Adult Health

## 2019-08-28 ENCOUNTER — Encounter: Payer: Self-pay | Admitting: Certified Nurse Midwife

## 2019-09-01 ENCOUNTER — Other Ambulatory Visit: Payer: Self-pay

## 2019-09-01 ENCOUNTER — Encounter: Payer: Self-pay | Admitting: Certified Nurse Midwife

## 2019-09-01 ENCOUNTER — Other Ambulatory Visit (HOSPITAL_COMMUNITY)
Admission: RE | Admit: 2019-09-01 | Discharge: 2019-09-01 | Disposition: A | Discharge status: Home / Self Care | Payer: Managed Care, Other (non HMO) | Source: Ambulatory Visit | Attending: Certified Nurse Midwife | Admitting: Certified Nurse Midwife

## 2019-09-01 ENCOUNTER — Ambulatory Visit (INDEPENDENT_AMBULATORY_CARE_PROVIDER_SITE_OTHER): Payer: Managed Care, Other (non HMO) | Admitting: Certified Nurse Midwife

## 2019-09-01 VITALS — BP 84/35 | HR 85 | Ht 71.0 in | Wt 338.1 lb

## 2019-09-01 DIAGNOSIS — Z803 Family history of malignant neoplasm of breast: Secondary | ICD-10-CM | POA: Diagnosis not present

## 2019-09-01 DIAGNOSIS — Z975 Presence of (intrauterine) contraceptive device: Secondary | ICD-10-CM

## 2019-09-01 DIAGNOSIS — Z124 Encounter for screening for malignant neoplasm of cervix: Secondary | ICD-10-CM | POA: Insufficient documentation

## 2019-09-01 DIAGNOSIS — Z01419 Encounter for gynecological examination (general) (routine) without abnormal findings: Secondary | ICD-10-CM | POA: Diagnosis not present

## 2019-09-01 NOTE — Progress Notes (Signed)
ANNUAL PREVENTATIVE CARE GYN  ENCOUNTER NOTE  Subjective:       Krista Rice is a 33 y.o. G67P0101 female here for a routine annual gynecologic exam.  Current complaints: 1. Needs Pap smear  Denies difficulty breathing or respiratory distress, chest pain, abdominal pain, excessive vaginal bleeding, dysuria, and leg pain or swelling.    Gynecologic History  No LMP recorded (lmp unknown). Patient has had an implant.  Contraception: condoms and Nexplanon  Last Pap: 2017. Results were: normal per patient  Obstetric History  OB History  Gravida Para Term Preterm AB Living  1 1   1   1   SAB TAB Ectopic Multiple Live Births          1    # Outcome Date GA Lbr Len/2nd Weight Sex Delivery Anes PTL Lv  1 Preterm 07/15/08   5 lb (2.268 kg) F CS-LTranv EPI  LIV     Complications: Ruptured, membranes, premature    Past Surgical History:  Procedure Laterality Date  . CESAREAN SECTION      Current Outpatient Medications on File Prior to Visit  Medication Sig Dispense Refill  . etonogestrel (NEXPLANON) 68 MG IMPL implant 1 each by Subdermal route once.     No current facility-administered medications on file prior to visit.     No Known Allergies  Social History   Socioeconomic History  . Marital status: Single    Spouse name: Not on file  . Number of children: Not on file  . Years of education: Not on file  . Highest education level: Not on file  Occupational History  . Not on file  Social Needs  . Financial resource strain: Not on file  . Food insecurity    Worry: Not on file    Inability: Not on file  . Transportation needs    Medical: Not on file    Non-medical: Not on file  Tobacco Use  . Smoking status: Never Smoker  . Smokeless tobacco: Never Used  Substance and Sexual Activity  . Alcohol use: Yes    Comment: occasional   . Drug use: Never  . Sexual activity: Yes    Birth control/protection: Implant, Condom  Lifestyle  . Physical activity    Days per  week: Not on file    Minutes per session: Not on file  . Stress: Not on file  Relationships  . Social Herbalist on phone: Not on file    Gets together: Not on file    Attends religious service: Not on file    Active member of club or organization: Not on file    Attends meetings of clubs or organizations: Not on file    Relationship status: Not on file  . Intimate partner violence    Fear of current or ex partner: Not on file    Emotionally abused: Not on file    Physically abused: Not on file    Forced sexual activity: Not on file  Other Topics Concern  . Not on file  Social History Narrative  . Not on file    Family History  Problem Relation Age of Onset  . Diabetes Mother   . Hypertension Mother   . Hypertension Father   . Breast cancer Maternal Aunt   . Ovarian cancer Neg Hx   . Colon cancer Neg Hx     The following portions of the patient's history were reviewed and updated as appropriate: allergies, current medications, past  family history, past medical history, past social history, past surgical history and problem list.  Review of Systems  ROS negative except as noted above. Information obtained from patient.    Objective:   BP (!) 84/35   Pulse 85   Ht 5\' 11"  (1.803 m)   Wt (!) 338 lb 1.6 oz (153.4 kg)   LMP  (LMP Unknown)   BMI 47.16 kg/m   CONSTITUTIONAL: Well-developed, well-nourished female in no acute distress.   PSYCHIATRIC: Normal mood and affect. Normal behavior. Normal judgment and thought content.  NEUROLGIC: Alert and oriented to person, place, and time. Normal muscle tone coordination. No cranial nerve deficit noted.  HENT:  Normocephalic, atraumatic, External right and left ear normal. Oropharynx is clear and moist  EYES: Conjunctivae and EOM are normal. Pupils are equal, round, and reactive to light. No scleral icterus.   NECK: Normal range of motion, supple, no masses.  Normal thyroid.   SKIN: Skin is warm and dry. No rash  noted. Not diaphoretic. No erythema. No pallor. Professional tattoos present. Nexplanon present in left upper arm.   CARDIOVASCULAR: Normal heart rate noted, regular rhythm, no murmur.  RESPIRATORY: Clear to auscultation bilaterally. Effort and breath sounds normal, no problems with respiration noted.  BREASTS: Symmetric in size. No masses, skin changes, nipple drainage, or lymphadenopathy. Fibrocystic changes present.   ABDOMEN: Soft, normal bowel sounds, no distention noted.  No tenderness, rebound or guarding. Obese.   PELVIC:  External Genitalia: Normal  Vagina: Normal  Cervix: Normal, Pap collected  Uterus: Unable to assess due to habitus  Adnexa: Unable to assess due to habitus  MUSCULOSKELETAL: Normal range of motion. No tenderness.  No cyanosis, clubbing, or edema.  2+ distal pulses.  LYMPHATIC: No Axillary, Supraclavicular, or Inguinal Adenopathy.  Assessment:   Annual gynecologic examination 33 y.o.   Contraception: condoms and Nexplanon   Obesity 3   Problem List Items Addressed This Visit      Other   Family history of breast cancer in female    Other Visit Diagnoses    Well woman exam    -  Primary   Relevant Orders   Cytology - PAP   Screening for cervical cancer       Relevant Orders   Cytology - PAP   Nexplanon in place          Plan:   Pap: Pap Co Test  Labs: Declined   Routine preventative health maintenance measures emphasized: Exercise/Diet/Weight control, Tobacco Warnings, Alcohol/Substance use risks, Stress Management, Peer Pressure Issues and Safe Sex; see AVS  Pamphlets provided for gynecologic cancer genetic screening options, patient to contact office if testing desired  Reviewed red flag symptoms and when to call  RTC x 1 year for ANNUAL EXAM or sooner if needed   33, CNM [facility], [facility] 09/01/19 4:01 PM

## 2019-09-01 NOTE — Patient Instructions (Addendum)
Fibrocystic Breast Changes  Fibrocystic breast changes are changes that can make your breasts swollen or painful. These changes happen when tiny sacs of fluid (cysts) form in the breast. This is a common condition. It does not mean that you have cancer. It usually happens because of hormone changes during a monthly period. Follow these instructions at home:  Check your breasts after every monthly period. If you do not have monthly periods, check your breasts on the first day of every month. Check for: ? Soreness. ? New swelling or puffiness. ? A change in breast size. ? A change in a lump that was already there.  Take over-the-counter and prescription medicines only as told by your doctor.  Wear a support or sports bra that fits well. Wear this support especially when you are exercising.  Avoid or have less caffeine, fat, and sugar in what you eat and drink as told by your doctor. Contact a doctor if:  You have fluid coming from your nipple, especially if the fluid has blood in it.  You have new lumps or bumps in your breast.  Your breast gets puffy, red, and painful.  You have changes in how your breast looks.  Your nipple looks flat or it sinks into your breast. Get help right away if:  Your breast turns red, and the redness is spreading. Summary  Fibrocystic breast changes are changes that can make your breasts swollen or painful.  This condition can happen when you have hormone changes during your monthly period.  With this condition, it is important to check your breasts after every monthly period. If you do not have monthly periods, check your breasts on the first day of every month. This information is not intended to replace advice given to you by your health care provider. Make sure you discuss any questions you have with your health care provider. Document Released: 10/26/2008 Document Revised: 03/06/2019 Document Reviewed: 07/27/2016 Elsevier Patient Education  2020  [place].   Obesity, Adult Obesity is having too much body fat. Being obese means that your weight is more than what is healthy for you. BMI is a number that explains how much body fat you have. If you have a BMI of 30 or more, you are obese. Obesity is often caused by eating or drinking more calories than your body uses. Changing your lifestyle can help you lose weight. Obesity can cause serious health problems, such as:  Stroke.  Coronary artery disease (CAD).  Type 2 diabetes.  Some types of cancer, including cancers of the colon, breast, uterus, and gallbladder.  Osteoarthritis.  High blood pressure (hypertension).  High cholesterol.  Sleep apnea.  Gallbladder stones.  Infertility problems. What are the causes?  Eating meals each day that are high in calories, sugar, and fat.  Being born with genes that may make you more likely to become obese.  Having a medical condition that causes obesity.  Taking certain medicines.  Sitting a lot (having a sedentary lifestyle).  Not getting enough sleep.  Drinking a lot of drinks that have sugar in them. What increases the risk?  Having a family history of obesity.  Being an [place] American woman.  Being a Hispanic man.  Living in an area with limited access to: ? Romilda Garret, recreation centers, or sidewalks. ? Healthy food choices, such as grocery stores and farmers' markets. What are the signs or symptoms? The main sign is having too much body fat. How is this treated?  Treatment for this  condition often includes changing your lifestyle. Treatment may include: ? Changing your diet. This may include making a healthy meal plan. ? Exercise. This may include activity that causes your heart to beat faster (aerobic exercise) and strength training. Work with your doctor to design a program that works for you. ? Medicine to help you lose weight. This may be used if you are not able to lose 1 pound a week after 6 weeks of  healthy eating and more exercise. ? Treating conditions that cause the obesity. ? Surgery. Options may include gastric banding and gastric bypass. This may be done if:  Other treatments have not helped to improve your condition.  You have a BMI of 40 or higher.  You have life-threatening health problems related to obesity. Follow these instructions at home: Eating and drinking   Follow advice from your doctor about what to eat and drink. Your doctor may tell you to: ? Limit fast food, sweets, and processed snack foods. ? Choose low-fat options. For example, choose low-fat milk instead of whole milk. ? Eat 5 or more servings of fruits or vegetables each day. ? Eat at home more often. This gives you more control over what you eat. ? Choose healthy foods when you eat out. ? Learn to read food labels. This will help you learn how much food is in 1 serving. ? Keep low-fat snacks available. ? Avoid drinks that have a lot of sugar in them. These include soda, fruit juice, iced tea with sugar, and flavored milk.  Drink enough water to keep your pee (urine) pale yellow.  Do not go on fad diets. Physical activity  Exercise often, as told by your doctor. Most adults should get up to 150 minutes of moderate-intensity exercise every week.Ask your doctor: ? What types of exercise are safe for you. ? How often you should exercise.  Warm up and stretch before being active.  Do slow stretching after being active (cool down).  Rest between times of being active. Lifestyle  Work with your doctor and a food expert (dietitian) to set a weight-loss goal that is best for you.  Limit your screen time.  Find ways to reward yourself that do not involve food.  Do not drink alcohol if: ? Your doctor tells you not to drink. ? You are pregnant, may be pregnant, or are planning to become pregnant.  If you drink alcohol: ? Limit how much you use to:  0-1 drink a day for women.  0-2 drinks a day  for men. ? Be aware of how much alcohol is in your drink. In the [place] one drink equals one 12 oz bottle of beer (355 mL), one 5 oz glass of wine (148 mL), or one 1 oz glass of hard liquor (44 mL). General instructions  Keep a weight-loss journal. This can help you keep track of: ? The food that you eat. ? How much exercise you get.  Take over-the-counter and prescription medicines only as told by your doctor.  Take vitamins and supplements only as told by your doctor.  Think about joining a support group.  Keep all follow-up visits as told by your doctor. This is important. Contact a doctor if:  You cannot meet your weight loss goal after you have changed your diet and lifestyle for 6 weeks. Get help right away if you:  Are having trouble breathing.  Are having thoughts of harming yourself. Summary  Obesity is having too much body fat.  Being  obese means that your weight is more than what is healthy for you.  Work with your doctor to set a weight-loss goal.  Get regular exercise as told by your doctor. This information is not intended to replace advice given to you by your health care provider. Make sure you discuss any questions you have with your health care provider. Document Released: 02/05/2012 Document Revised: 07/18/2018 Document Reviewed: 07/18/2018 Elsevier Patient Education  2020 [place] is this medicine? ETONOGESTREL (et oh noe JES trel) is a contraceptive (birth control) device. It is used to prevent pregnancy. It can be used for up to 3 years. This medicine may be used for other purposes; ask your health care provider or pharmacist if you have questions. COMMON BRAND NAME(S): Implanon, Nexplanon What should I tell my health care provider before I take this medicine? They need to know if you have any of these conditions:  abnormal vaginal bleeding  blood vessel disease or blood clots  breast, cervical, endometrial, ovarian,  liver, or uterine cancer  diabetes  gallbladder disease  heart disease or recent heart attack  high blood pressure  high cholesterol or triglycerides  kidney disease  liver disease  migraine headaches  seizures  stroke  tobacco smoker  an unusual or allergic reaction to etonogestrel, anesthetics or antiseptics, other medicines, foods, dyes, or preservatives  pregnant or trying to get pregnant  breast-feeding How should I use this medicine? This device is inserted just under the skin on the inner side of your upper arm by a health care professional. Talk to your pediatrician regarding the use of this medicine in children. Special care may be needed. Overdosage: If you think you have taken too much of this medicine contact a poison control center or emergency room at once. NOTE: This medicine is only for you. Do not share this medicine with others. What if I miss a dose? This does not apply. What may interact with this medicine? Do not take this medicine with any of the following medications:  amprenavir  fosamprenavir This medicine may also interact with the following medications:  acitretin  aprepitant  armodafinil  bexarotene  bosentan  carbamazepine  certain medicines for fungal infections like fluconazole, ketoconazole, itraconazole and voriconazole  certain medicines to treat hepatitis, HIV or AIDS  cyclosporine  felbamate  griseofulvin  lamotrigine  modafinil  oxcarbazepine  phenobarbital  phenytoin  primidone  rifabutin  rifampin  rifapentine  St. John's wort  topiramate This list may not describe all possible interactions. Give your health care provider a list of all the medicines, herbs, non-prescription drugs, or dietary supplements you use. Also tell them if you smoke, drink alcohol, or use illegal drugs. Some items may interact with your medicine. What should I watch for while using this medicine? This product does  not protect you against HIV infection (AIDS) or other sexually transmitted diseases. You should be able to feel the implant by pressing your fingertips over the skin where it was inserted. Contact your doctor if you cannot feel the implant, and use a non-hormonal birth control method (such as condoms) until your doctor confirms that the implant is in place. Contact your doctor if you think that the implant may have broken or become bent while in your arm. You will receive a user card from your health care provider after the implant is inserted. The card is a record of the location of the implant in your upper arm and when it should be removed.  Keep this card with your health records. What side effects may I notice from receiving this medicine? Side effects that you should report to your doctor or health care professional as soon as possible:  allergic reactions like skin rash, itching or hives, swelling of the face, lips, or tongue  breast lumps, breast tissue changes, or discharge  breathing problems  changes in emotions or moods  if you feel that the implant may have broken or bent while in your arm  high blood pressure  pain, irritation, swelling, or bruising at the insertion site  scar at site of insertion  signs of infection at the insertion site such as fever, and skin redness, pain or discharge  signs and symptoms of a blood clot such as breathing problems; changes in vision; chest pain; severe, sudden headache; pain, swelling, warmth in the leg; trouble speaking; sudden numbness or weakness of the face, arm or leg  signs and symptoms of liver injury like dark yellow or brown urine; general ill feeling or flu-like symptoms; light-colored stools; loss of appetite; nausea; right upper belly pain; unusually weak or tired; yellowing of the eyes or skin  unusual vaginal bleeding, discharge Side effects that usually do not require medical attention (report to your doctor or health care  professional if they continue or are bothersome):  acne  breast pain or tenderness  headache  irregular menstrual bleeding  nausea This list may not describe all possible side effects. Call your doctor for medical advice about side effects. You may report side effects to FDA at 1-800-FDA-1088. Where should I keep my medicine? This drug is given in a hospital or clinic and will not be stored at home. NOTE: This sheet is a summary. It may not cover all possible information. If you have questions about this medicine, talk to your doctor, pharmacist, or health care provider.  2020 Elsevier/Gold Standard (2017-10-02 14:11:42) Preventive Care 26-68 Years Old, Female Preventive care refers to visits with your health care provider and lifestyle choices that can promote health and wellness. This includes:  A yearly physical exam. This may also be called an annual well check.  Regular dental visits and eye exams.  Immunizations.  Screening for certain conditions.  Healthy lifestyle choices, such as eating a healthy diet, getting regular exercise, not using drugs or products that contain nicotine and tobacco, and limiting alcohol use. What can I expect for my preventive care visit? Physical exam Your health care provider will check your:  Height and weight. This may be used to calculate body mass index (BMI), which tells if you are at a healthy weight.  Heart rate and blood pressure.  Skin for abnormal spots. Counseling Your health care provider may ask you questions about your:  Alcohol, tobacco, and drug use.  Emotional well-being.  Home and relationship well-being.  Sexual activity.  Eating habits.  Work and work Statistician.  Method of birth control.  Menstrual cycle.  Pregnancy history. What immunizations do I need?  Influenza (flu) vaccine  This is recommended every year. Tetanus, diphtheria, and pertussis (Tdap) vaccine  You may need a Td booster every 10  years. Varicella (chickenpox) vaccine  You may need this if you have not been vaccinated. Human papillomavirus (HPV) vaccine  If recommended by your health care provider, you may need three doses over 6 months. Measles, mumps, and rubella (MMR) vaccine  You may need at least one dose of MMR. You may also need a second dose. Meningococcal conjugate (MenACWY) vaccine  One dose is recommended if you are age 74-21 years and a first-year college student living in a residence hall, or if you have one of several medical conditions. You may also need additional booster doses. Pneumococcal conjugate (PCV13) vaccine  You may need this if you have certain conditions and were not previously vaccinated. Pneumococcal polysaccharide (PPSV23) vaccine  You may need one or two doses if you smoke cigarettes or if you have certain conditions. Hepatitis A vaccine  You may need this if you have certain conditions or if you travel or work in places where you may be exposed to hepatitis A. Hepatitis B vaccine  You may need this if you have certain conditions or if you travel or work in places where you may be exposed to hepatitis B. Haemophilus influenzae type b (Hib) vaccine  You may need this if you have certain conditions. You may receive vaccines as individual doses or as more than one vaccine together in one shot (combination vaccines). Talk with your health care provider about the risks and benefits of combination vaccines. What tests do I need?  Blood tests  Lipid and cholesterol levels. These may be checked every 5 years starting at age 6.  Hepatitis C test.  Hepatitis B test. Screening  Diabetes screening. This is done by checking your blood sugar (glucose) after you have not eaten for a while (fasting).  Sexually transmitted disease (STD) testing.  BRCA-related cancer screening. This may be done if you have a family history of breast, ovarian, tubal, or peritoneal cancers.  Pelvic  exam and Pap test. This may be done every 3 years starting at age 52. Starting at age 70, this may be done every 5 years if you have a Pap test in combination with an HPV test. Talk with your health care provider about your test results, treatment options, and if necessary, the need for more tests. Follow these instructions at home: Eating and drinking   Eat a diet that includes fresh fruits and vegetables, whole grains, lean protein, and low-fat dairy.  Take vitamin and mineral supplements as recommended by your health care provider.  Do not drink alcohol if: ? Your health care provider tells you not to drink. ? You are pregnant, may be pregnant, or are planning to become pregnant.  If you drink alcohol: ? Limit how much you have to 0-1 drink a day. ? Be aware of how much alcohol is in your drink. In the [place] one drink equals one 12 oz bottle of beer (355 mL), one 5 oz glass of wine (148 mL), or one 1 oz glass of hard liquor (44 mL). Lifestyle  Take daily care of your teeth and gums.  Stay active. Exercise for at least 30 minutes on 5 or more days each week.  Do not use any products that contain nicotine or tobacco, such as cigarettes, e-cigarettes, and chewing tobacco. If you need help quitting, ask your health care provider.  If you are sexually active, practice safe sex. Use a condom or other form of birth control (contraception) in order to prevent pregnancy and STIs (sexually transmitted infections). If you plan to become pregnant, see your health care provider for a preconception visit. What's next?  Visit your health care provider once a year for a well check visit.  Ask your health care provider how often you should have your eyes and teeth checked.  Stay up to date on all vaccines. This information is not intended to replace advice given  to you by your health care provider. Make sure you discuss any questions you have with your health care provider. Document Released:  01/09/2002 Document Revised: 07/25/2018 Document Reviewed: 07/25/2018 Elsevier Patient Education  2020 [employer].

## 2019-09-09 LAB — CYTOLOGY - PAP
Adequacy: ABSENT
Diagnosis: NEGATIVE
High risk HPV: NEGATIVE

## 2020-03-02 ENCOUNTER — Telehealth: Payer: Self-pay

## 2020-03-02 NOTE — Telephone Encounter (Signed)
Please contact patient. Thanks, JML.

## 2020-03-02 NOTE — Telephone Encounter (Signed)
Called pt to discuss recent my chart message. Pt called around 12:52 during our staff meeting. Phones were on off duty instead of night duty.   Phone should have rolled to call a nurse but instead stayed in the queue.   Apologized to pt for the mishap. Pt was very understanding and kind.  She wanted to get her 34 y/o daughter an appt. Daughter has no gyn concerns.   Advised pt to have daughter seen at peds. If she has any gyn concerns we will be glad to see her. First gyn exam is 22 or sooner if SA.   Pt voices understanding.

## 2020-09-02 ENCOUNTER — Other Ambulatory Visit: Payer: Self-pay

## 2020-09-02 ENCOUNTER — Encounter: Payer: Self-pay | Admitting: Certified Nurse Midwife

## 2020-09-02 ENCOUNTER — Ambulatory Visit (INDEPENDENT_AMBULATORY_CARE_PROVIDER_SITE_OTHER): Payer: Managed Care, Other (non HMO) | Admitting: Certified Nurse Midwife

## 2020-09-02 ENCOUNTER — Telehealth: Payer: Self-pay

## 2020-09-02 VITALS — BP 114/76 | HR 87 | Ht 71.0 in | Wt 325.3 lb

## 2020-09-02 DIAGNOSIS — Z975 Presence of (intrauterine) contraceptive device: Secondary | ICD-10-CM | POA: Diagnosis not present

## 2020-09-02 DIAGNOSIS — Z6841 Body Mass Index (BMI) 40.0 and over, adult: Secondary | ICD-10-CM | POA: Diagnosis not present

## 2020-09-02 DIAGNOSIS — Z01419 Encounter for gynecological examination (general) (routine) without abnormal findings: Secondary | ICD-10-CM | POA: Diagnosis not present

## 2020-09-02 DIAGNOSIS — Z803 Family history of malignant neoplasm of breast: Secondary | ICD-10-CM

## 2020-09-02 NOTE — Progress Notes (Signed)
ANNUAL PREVENTATIVE CARE GYN  ENCOUNTER NOTE  Subjective:       Krista Rice is a 34 y.o. G82P0101 female here for a routine annual gynecologic exam.    Denies difficulty breathing or respiratory distress, chest pain, abdominal pain, excessive vaginal bleeding, dysuria, and leg pain or swelling.    Gynecologic History  Patient's last menstrual period was 06/24/2020 (approximate). Period Duration (Days): 2/3 Period Pattern: (!) Irregular Menstrual Flow: Light Menstrual Control: Thin pad Menstrual Control Change Freq (Hours): 6-8 Dysmenorrhea: (!) Moderate Dysmenorrhea Symptoms: Cramping  Contraception: Nexplanon  Last Pap: 08/2019. Results were: Neg/Neg  Obstetric History  OB History  Gravida Para Term Preterm AB Living  1 1   1   1   SAB TAB Ectopic Multiple Live Births          1    # Outcome Date GA Lbr Len/2nd Weight Sex Delivery Anes PTL Lv  1 Preterm 07/15/08   5 lb (2.268 kg) F CS-LTranv EPI  LIV     Complications: Ruptured, membranes, premature    History reviewed. No pertinent past medical history.  Past Surgical History:  Procedure Laterality Date  . CESAREAN SECTION      Current Outpatient Medications on File Prior to Visit  Medication Sig Dispense Refill  . etonogestrel (NEXPLANON) 68 MG IMPL implant 1 each by Subdermal route once.     No current facility-administered medications on file prior to visit.    No Known Allergies  Social History   Socioeconomic History  . Marital status: Single    Spouse name: Not on file  . Number of children: Not on file  . Years of education: Not on file  . Highest education level: Not on file  Occupational History  . Not on file  Tobacco Use  . Smoking status: Never Smoker  . Smokeless tobacco: Never Used  Vaping Use  . Vaping Use: Never used  Substance and Sexual Activity  . Alcohol use: Yes    Comment: socially  . Drug use: Never  . Sexual activity: Yes    Birth control/protection: Implant, Condom   Other Topics Concern  . Not on file  Social History Narrative  . Not on file   Social Determinants of Health   Financial Resource Strain:   . Difficulty of Paying Living Expenses: Not on file  Food Insecurity:   . Worried About 07/17/08 in the Last Year: Not on file  . Ran Out of Food in the Last Year: Not on file  Transportation Needs:   . Lack of Transportation (Medical): Not on file  . Lack of Transportation (Non-Medical): Not on file  Physical Activity:   . Days of Exercise per Week: Not on file  . Minutes of Exercise per Session: Not on file  Stress:   . Feeling of Stress : Not on file  Social Connections:   . Frequency of Communication with Friends and Family: Not on file  . Frequency of Social Gatherings with Friends and Family: Not on file  . Attends Religious Services: Not on file  . Active Member of Clubs or Organizations: Not on file  . Attends Programme researcher, broadcasting/film/video Meetings: Not on file  . Marital Status: Not on file  Intimate Partner Violence:   . Fear of Current or Ex-Partner: Not on file  . Emotionally Abused: Not on file  . Physically Abused: Not on file  . Sexually Abused: Not on file    Family History  Problem  Relation Age of Onset  . Diabetes Mother   . Hypertension Mother   . Hypertension Father   . Breast cancer Maternal Aunt   . Ovarian cancer Neg Hx   . Colon cancer Neg Hx     The following portions of the patient's history were reviewed and updated as appropriate: allergies, current medications, past family history, past medical history, past social history, past surgical history and problem list.  Review of Systems  ROS negative except as noted above. Information obtained from patient.    Objective:   BP 114/76   Pulse 87   Ht 5\' 11"  (1.803 m)   Wt (!) 325 lb 4.8 oz (147.6 kg)   LMP 06/24/2020 (Approximate)   BMI 45.37 kg/m    CONSTITUTIONAL: Well-developed, well-nourished female in no acute distress.   PSYCHIATRIC:  Normal mood and affect. Normal behavior. Normal judgment and thought content.  NEUROLGIC: Alert and oriented to person, place, and time. Normal muscle tone coordination. No cranial nerve deficit noted.  HENT:  Normocephalic, atraumatic, External right and left ear normal.  EYES: Conjunctivae and EOM are normal. Pupils are equal and round.   NECK: Normal range of motion, supple, no masses.  Normal thyroid.   SKIN: Skin is warm and dry. No rash noted. Not diaphoretic. No erythema. No pallor. Professional tattoos present. Nexplanon in place.   CARDIOVASCULAR: Normal heart rate noted, regular rhythm, no murmur.  RESPIRATORY: Clear to auscultation bilaterally. Effort and breath sounds normal, no problems with respiration noted.  BREASTS: Symmetric in size. No masses, skin changes, nipple drainage, or lymphadenopathy.  ABDOMEN: Soft, normal bowel sounds, no distention noted.  No tenderness, rebound or guarding. Obese.   PELVIC:  External Genitalia: Normal  Vagina: Normal  Cervix: Normal  Uterus: Normal  Adnexa: Normal   MUSCULOSKELETAL: Normal range of motion. No tenderness.  No cyanosis, clubbing, or edema.  2+ distal pulses.  LYMPHATIC: No Axillary, Supraclavicular, or Inguinal Adenopathy.  Assessment:   Annual gynecologic examination 34 y.o.   Contraception: Nexplanon   Obesity 3   Problem List Items Addressed This Visit      Other   Family history of breast cancer in female    Other Visit Diagnoses    Well woman exam    -  Primary   Nexplanon in place       BMI 45.0-49.9, adult (HCC)          Plan:   Pap: Not needed  Labs: Declined   Routine preventative health maintenance measures emphasized: Exercise/Diet/Weight control, Tobacco Warnings, Alcohol/Substance use risks and Stress Management; see AVS  Reviewed red flag symptoms and when to call  Return to Clinic - 1 Year for [birth year]1984 or sooner if needed   [employer], CNM  [facility],  [facility] 09/02/20 8:32 AM

## 2020-09-02 NOTE — Telephone Encounter (Signed)
When checking in this patient I made her aware that she had a 30.00 copay, patient stated that she doesn't think she should have a 30.00 copay. Informed patient that we are considered a specialty clinic and that's why she may see a difference in her copay. Patient stated that we were her primary care and I informed patient we are only an OBGYN so we bill for specialty services. Patient states that she may switch and see another clinic as she was under the impression we were a primary care.

## 2020-09-02 NOTE — Patient Instructions (Addendum)
Preventive Care 21-33 Years Old, Female Preventive care refers to visits with your health care provider and lifestyle choices that can promote health and wellness. This includes:  A yearly physical exam. This may also be called an annual well check.  Regular dental visits and eye exams.  Immunizations.  Screening for certain conditions.  Healthy lifestyle choices, such as eating a healthy diet, getting regular exercise, not using drugs or products that contain nicotine and tobacco, and limiting alcohol use. What can I expect for my preventive care visit? Physical exam Your health care provider will check your:  Height and weight. This may be used to calculate body mass index (BMI), which tells if you are at a healthy weight.  Heart rate and blood pressure.  Skin for abnormal spots. Counseling Your health care provider may ask you questions about your:  Alcohol, tobacco, and drug use.  Emotional well-being.  Home and relationship well-being.  Sexual activity.  Eating habits.  Work and work environment.  Method of birth control.  Menstrual cycle.  Pregnancy history. What immunizations do I need?  Influenza (flu) vaccine  This is recommended every year. Tetanus, diphtheria, and pertussis (Tdap) vaccine  You may need a Td booster every 10 years. Varicella (chickenpox) vaccine  You may need this if you have not been vaccinated. Human papillomavirus (HPV) vaccine  If recommended by your health care provider, you may need three doses over 6 months. Measles, mumps, and rubella (MMR) vaccine  You may need at least one dose of MMR. You may also need a second dose. Meningococcal conjugate (MenACWY) vaccine  One dose is recommended if you are age 19-21 years and a first-year college student living in a residence hall, or if you have one of several medical conditions. You may also need additional booster doses. Pneumococcal conjugate (PCV13) vaccine  You may need  this if you have certain conditions and were not previously vaccinated. Pneumococcal polysaccharide (PPSV23) vaccine  You may need one or two doses if you smoke cigarettes or if you have certain conditions. Hepatitis A vaccine  You may need this if you have certain conditions or if you travel or work in places where you may be exposed to hepatitis A. Hepatitis B vaccine  You may need this if you have certain conditions or if you travel or work in places where you may be exposed to hepatitis B. Haemophilus influenzae type b (Hib) vaccine  You may need this if you have certain conditions. You may receive vaccines as individual doses or as more than one vaccine together in one shot (combination vaccines). Talk with your health care provider about the risks and benefits of combination vaccines. What tests do I need?  Blood tests  Lipid and cholesterol levels. These may be checked every 5 years starting at age 20.  Hepatitis C test.  Hepatitis B test. Screening  Diabetes screening. This is done by checking your blood sugar (glucose) after you have not eaten for a while (fasting).  Sexually transmitted disease (STD) testing.  BRCA-related cancer screening. This may be done if you have a family history of breast, ovarian, tubal, or peritoneal cancers.  Pelvic exam and Pap test. This may be done every 3 years starting at age 21. Starting at age 30, this may be done every 5 years if you have a Pap test in combination with an HPV test. Talk with your health care provider about your test results, treatment options, and if necessary, the need for more tests.   Follow these instructions at home: Eating and drinking   Eat a diet that includes fresh fruits and vegetables, whole grains, lean protein, and low-fat dairy.  Take vitamin and mineral supplements as recommended by your health care provider.  Do not drink alcohol if: ? Your health care provider tells you not to drink. ? You are  pregnant, may be pregnant, or are planning to become pregnant.  If you drink alcohol: ? Limit how much you have to 0-1 drink a day. ? Be aware of how much alcohol is in your drink. In the [place] one drink equals one 12 oz bottle of beer (355 mL), one 5 oz glass of wine (148 mL), or one 1 oz glass of hard liquor (44 mL). Lifestyle  Take daily care of your teeth and gums.  Stay active. Exercise for at least 30 minutes on 5 or more days each week.  Do not use any products that contain nicotine or tobacco, such as cigarettes, e-cigarettes, and chewing tobacco. If you need help quitting, ask your health care provider.  If you are sexually active, practice safe sex. Use a condom or other form of birth control (contraception) in order to prevent pregnancy and STIs (sexually transmitted infections). If you plan to become pregnant, see your health care provider for a preconception visit. What's next?  Visit your health care provider once a year for a well check visit.  Ask your health care provider how often you should have your eyes and teeth checked.  Stay up to date on all vaccines. This information is not intended to replace advice given to you by your health care provider. Make sure you discuss any questions you have with your health care provider. Document Revised: 07/25/2018 Document Reviewed: 07/25/2018 Elsevier Patient Education  2020 [place] is this medicine? ETONOGESTREL (et oh noe JES trel) is a contraceptive (birth control) device. It is used to prevent pregnancy. It can be used for up to 3 years. This medicine may be used for other purposes; ask your health care provider or pharmacist if you have questions. COMMON BRAND NAME(S): Implanon, Nexplanon What should I tell my health care provider before I take this medicine? They need to know if you have any of these conditions:  abnormal vaginal bleeding  blood vessel disease or blood  clots  breast, cervical, endometrial, ovarian, liver, or uterine cancer  diabetes  gallbladder disease  heart disease or recent heart attack  high blood pressure  high cholesterol or triglycerides  kidney disease  liver disease  migraine headaches  seizures  stroke  tobacco smoker  an unusual or allergic reaction to etonogestrel, anesthetics or antiseptics, other medicines, foods, dyes, or preservatives  pregnant or trying to get pregnant  breast-feeding How should I use this medicine? This device is inserted just under the skin on the inner side of your upper arm by a health care professional. Talk to your pediatrician regarding the use of this medicine in children. Special care may be needed. Overdosage: If you think you have taken too much of this medicine contact a poison control center or emergency room at once. NOTE: This medicine is only for you. Do not share this medicine with others. What if I miss a dose? This does not apply. What may interact with this medicine? Do not take this medicine with any of the following medications:  amprenavir  fosamprenavir This medicine may also interact with the following medications:  acitretin  aprepitant  armodafinil  bexarotene  bosentan  carbamazepine  certain medicines for fungal infections like fluconazole, ketoconazole, itraconazole and voriconazole  certain medicines to treat hepatitis, HIV or AIDS  cyclosporine  felbamate  griseofulvin  lamotrigine  modafinil  oxcarbazepine  phenobarbital  phenytoin  primidone  rifabutin  rifampin  rifapentine  St. John's wort  topiramate This list may not describe all possible interactions. Give your health care provider a list of all the medicines, herbs, non-prescription drugs, or dietary supplements you use. Also tell them if you smoke, drink alcohol, or use illegal drugs. Some items may interact with your medicine. What should I watch for  while using this medicine? This product does not protect you against HIV infection (AIDS) or other sexually transmitted diseases. You should be able to feel the implant by pressing your fingertips over the skin where it was inserted. Contact your doctor if you cannot feel the implant, and use a non-hormonal birth control method (such as condoms) until your doctor confirms that the implant is in place. Contact your doctor if you think that the implant may have broken or become bent while in your arm. You will receive a user card from your health care provider after the implant is inserted. The card is a record of the location of the implant in your upper arm and when it should be removed. Keep this card with your health records. What side effects may I notice from receiving this medicine? Side effects that you should report to your doctor or health care professional as soon as possible:  allergic reactions like skin rash, itching or hives, swelling of the face, lips, or tongue  breast lumps, breast tissue changes, or discharge  breathing problems  changes in emotions or moods  coughing up blood  if you feel that the implant may have broken or bent while in your arm  high blood pressure  pain, irritation, swelling, or bruising at the insertion site  scar at site of insertion  signs of infection at the insertion site such as fever, and skin redness, pain or discharge  signs and symptoms of a blood clot such as breathing problems; changes in vision; chest pain; severe, sudden headache; pain, swelling, warmth in the leg; trouble speaking; sudden numbness or weakness of the face, arm or leg  signs and symptoms of liver injury like dark yellow or brown urine; general ill feeling or flu-like symptoms; light-colored stools; loss of appetite; nausea; right upper belly pain; unusually weak or tired; yellowing of the eyes or skin  unusual vaginal bleeding, discharge Side effects that usually do  not require medical attention (report to your doctor or health care professional if they continue or are bothersome):  acne  breast pain or tenderness  headache  irregular menstrual bleeding  nausea This list may not describe all possible side effects. Call your doctor for medical advice about side effects. You may report side effects to FDA at 1-800-FDA-1088. Where should I keep my medicine? This drug is given in a hospital or clinic and will not be stored at home. NOTE: This sheet is a summary. It may not cover all possible information. If you have questions about this medicine, talk to your doctor, pharmacist, or health care provider.  2020 Elsevier/Gold Standard (2019-08-26 11:33:04)

## 2020-12-26 ENCOUNTER — Other Ambulatory Visit: Payer: Self-pay

## 2020-12-26 ENCOUNTER — Emergency Department: Payer: Managed Care, Other (non HMO)

## 2020-12-26 ENCOUNTER — Encounter: Payer: Self-pay | Admitting: Emergency Medicine

## 2020-12-26 ENCOUNTER — Observation Stay: Payer: Managed Care, Other (non HMO)

## 2020-12-26 ENCOUNTER — Observation Stay
Admission: EM | Admit: 2020-12-26 | Discharge: 2020-12-27 | Disposition: A | Discharge status: Home / Self Care | Payer: Managed Care, Other (non HMO) | Attending: Internal Medicine | Admitting: Internal Medicine

## 2020-12-26 DIAGNOSIS — R55 Syncope and collapse: Secondary | ICD-10-CM | POA: Diagnosis present

## 2020-12-26 DIAGNOSIS — R778 Other specified abnormalities of plasma proteins: Secondary | ICD-10-CM

## 2020-12-26 DIAGNOSIS — R7989 Other specified abnormal findings of blood chemistry: Secondary | ICD-10-CM | POA: Diagnosis not present

## 2020-12-26 DIAGNOSIS — U071 COVID-19: Secondary | ICD-10-CM | POA: Diagnosis not present

## 2020-12-26 DIAGNOSIS — R748 Abnormal levels of other serum enzymes: Secondary | ICD-10-CM | POA: Diagnosis not present

## 2020-12-26 DIAGNOSIS — I471 Supraventricular tachycardia, unspecified: Secondary | ICD-10-CM | POA: Diagnosis present

## 2020-12-26 DIAGNOSIS — R791 Abnormal coagulation profile: Secondary | ICD-10-CM | POA: Insufficient documentation

## 2020-12-26 LAB — CBC WITH DIFFERENTIAL/PLATELET
Abs Immature Granulocytes: 0.07 10*3/uL (ref 0.00–0.07)
Basophils Absolute: 0 10*3/uL (ref 0.0–0.1)
Basophils Relative: 1 %
Eosinophils Absolute: 0.1 10*3/uL (ref 0.0–0.5)
Eosinophils Relative: 1 %
HCT: 40.3 % (ref 36.0–46.0)
Hemoglobin: 13.5 g/dL (ref 12.0–15.0)
Immature Granulocytes: 1 %
Lymphocytes Relative: 31 %
Lymphs Abs: 2.4 10*3/uL (ref 0.7–4.0)
MCH: 29.4 pg (ref 26.0–34.0)
MCHC: 33.5 g/dL (ref 30.0–36.0)
MCV: 87.8 fL (ref 80.0–100.0)
Monocytes Absolute: 0.9 10*3/uL (ref 0.1–1.0)
Monocytes Relative: 11 %
Neutro Abs: 4.2 10*3/uL (ref 1.7–7.7)
Neutrophils Relative %: 55 %
Platelets: 247 10*3/uL (ref 150–400)
RBC: 4.59 MIL/uL (ref 3.87–5.11)
RDW: 13.2 % (ref 11.5–15.5)
WBC: 7.7 10*3/uL (ref 4.0–10.5)
nRBC: 0 % (ref 0.0–0.2)

## 2020-12-26 LAB — TROPONIN I (HIGH SENSITIVITY)
Troponin I (High Sensitivity): 8 ng/L (ref <18)
Troponin I (High Sensitivity): 37 ng/L — ABNORMAL HIGH (ref <18)
Troponin I (High Sensitivity): 59 ng/L — ABNORMAL HIGH (ref <18)
Troponin I (High Sensitivity): 70 ng/L — ABNORMAL HIGH (ref <18)
Troponin I (High Sensitivity): 34 ng/L — ABNORMAL HIGH (ref <18)

## 2020-12-26 LAB — HCG, QUANTITATIVE, PREGNANCY: hCG, Beta Chain, Quant, S: <1 m[IU]/mL (ref <5)

## 2020-12-26 LAB — COMPREHENSIVE METABOLIC PANEL
ALT: 32 U/L (ref 0–44)
AST: 33 U/L (ref 15–41)
Albumin: 3.6 g/dL (ref 3.5–5.0)
Alkaline Phosphatase: 47 U/L (ref 38–126)
Anion gap: 11 (ref 5–15)
BUN: 11 mg/dL (ref 6–20)
CO2: 19 mmol/L — ABNORMAL LOW (ref 22–32)
Calcium: 7.8 mg/dL — ABNORMAL LOW (ref 8.9–10.3)
Chloride: 109 mmol/L (ref 98–111)
Creatinine, Ser: 0.86 mg/dL (ref 0.44–1.00)
GFR, Estimated: >60 mL/min (ref >60)
Glucose, Bld: 91 mg/dL (ref 70–99)
Potassium: 3.6 mmol/L (ref 3.5–5.1)
Sodium: 139 mmol/L (ref 135–145)
Total Bilirubin: 0.7 mg/dL (ref 0.3–1.2)
Total Protein: 7.2 g/dL (ref 6.5–8.1)

## 2020-12-26 LAB — FIBRIN DERIVATIVES D-DIMER (ARMC ONLY): Fibrin derivatives D-dimer (ARMC): 2592.87 ng/mL (FEU) — ABNORMAL HIGH (ref 0.00–499.00)

## 2020-12-26 LAB — TSH: TSH: 0.855 u[IU]/mL (ref 0.350–4.500)

## 2020-12-26 LAB — MAGNESIUM: Magnesium: 1.9 mg/dL (ref 1.7–2.4)

## 2020-12-26 IMAGING — CR DG SACRUM/COCCYX 2+V
1 series · 3 of 3 positions shown · non-contrast
Comparison: None.

CLINICAL DATA: Syncope and fall.  Pain.

EXAM:
SACRUM AND COCCYX - 2+ VIEW

[Series 1: dg sacrum/coccyx · 0.14mm/px · 3 of 3 slices shown]
[im 1/3]
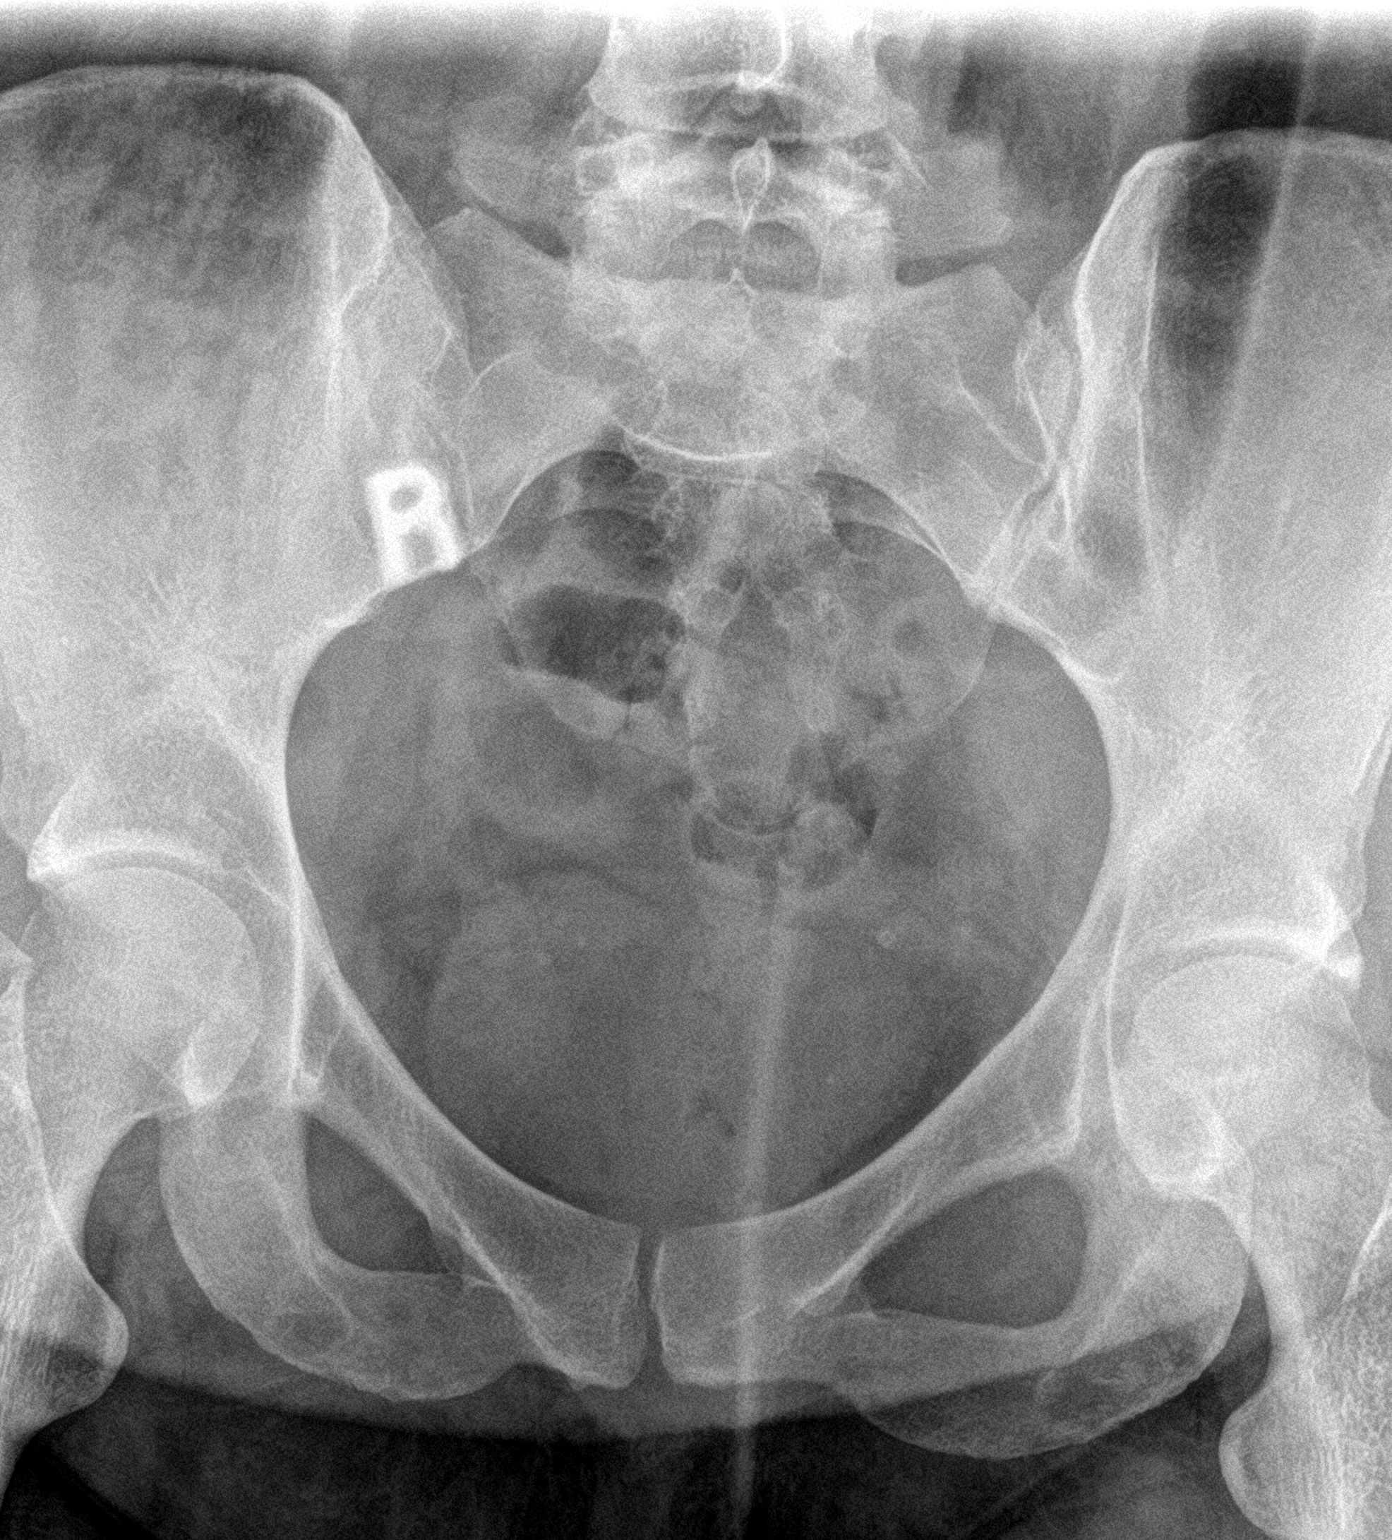
[im 2/3]
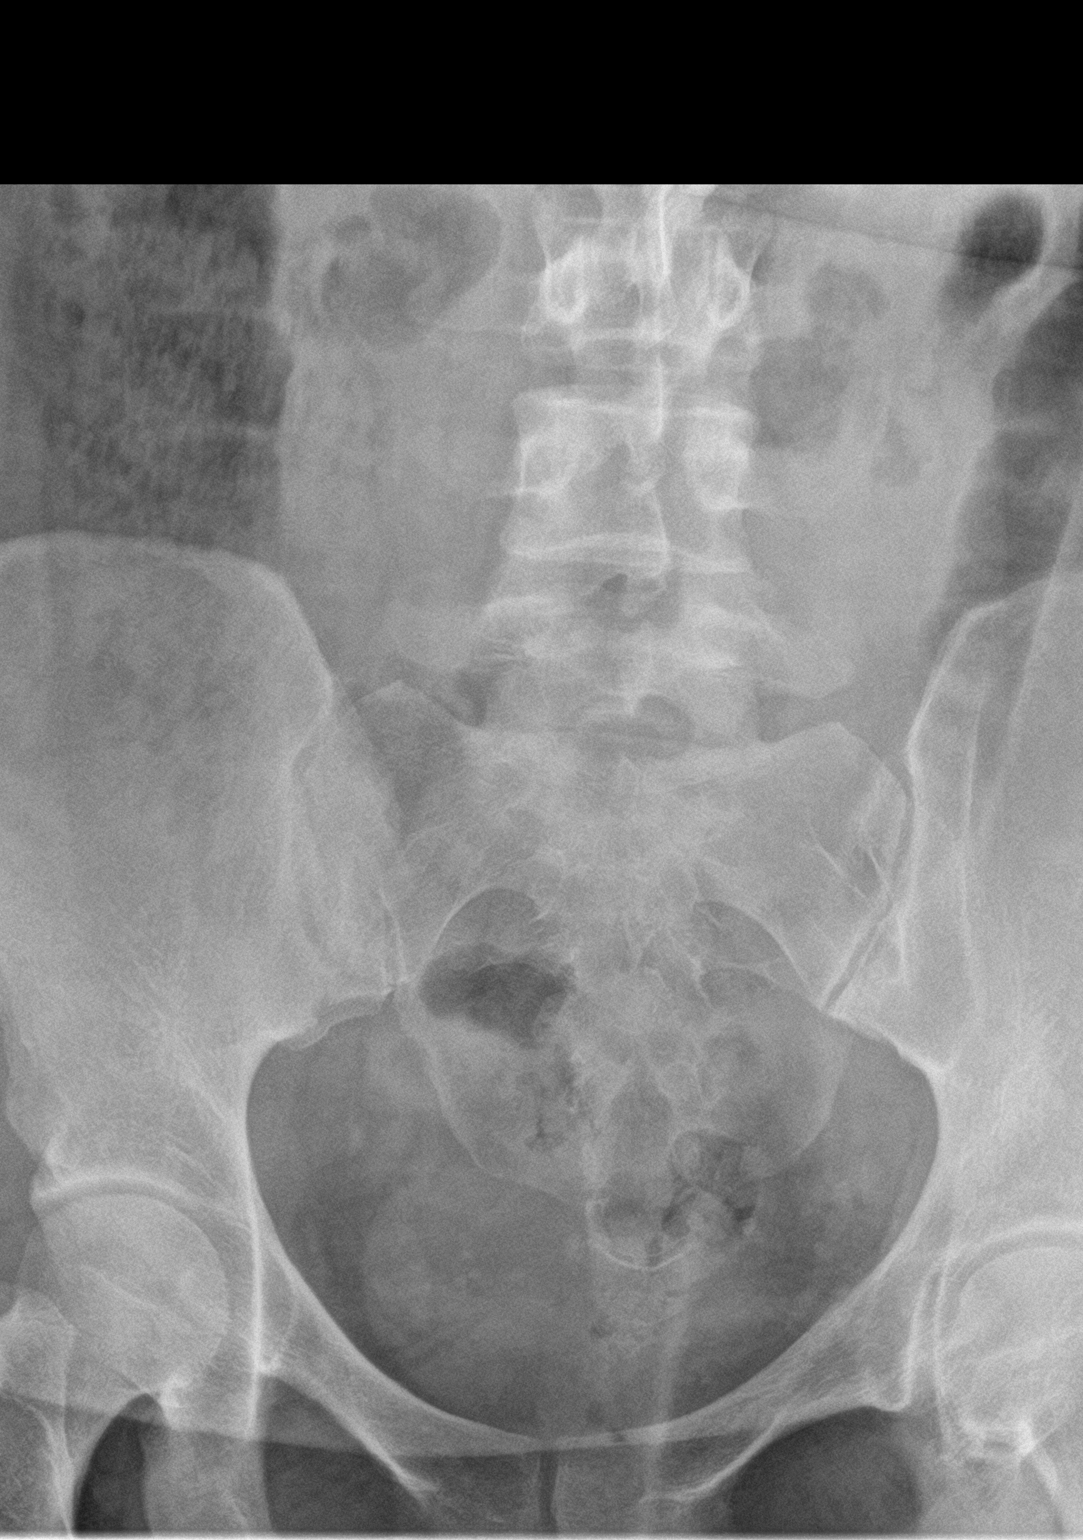
[im 3/3]
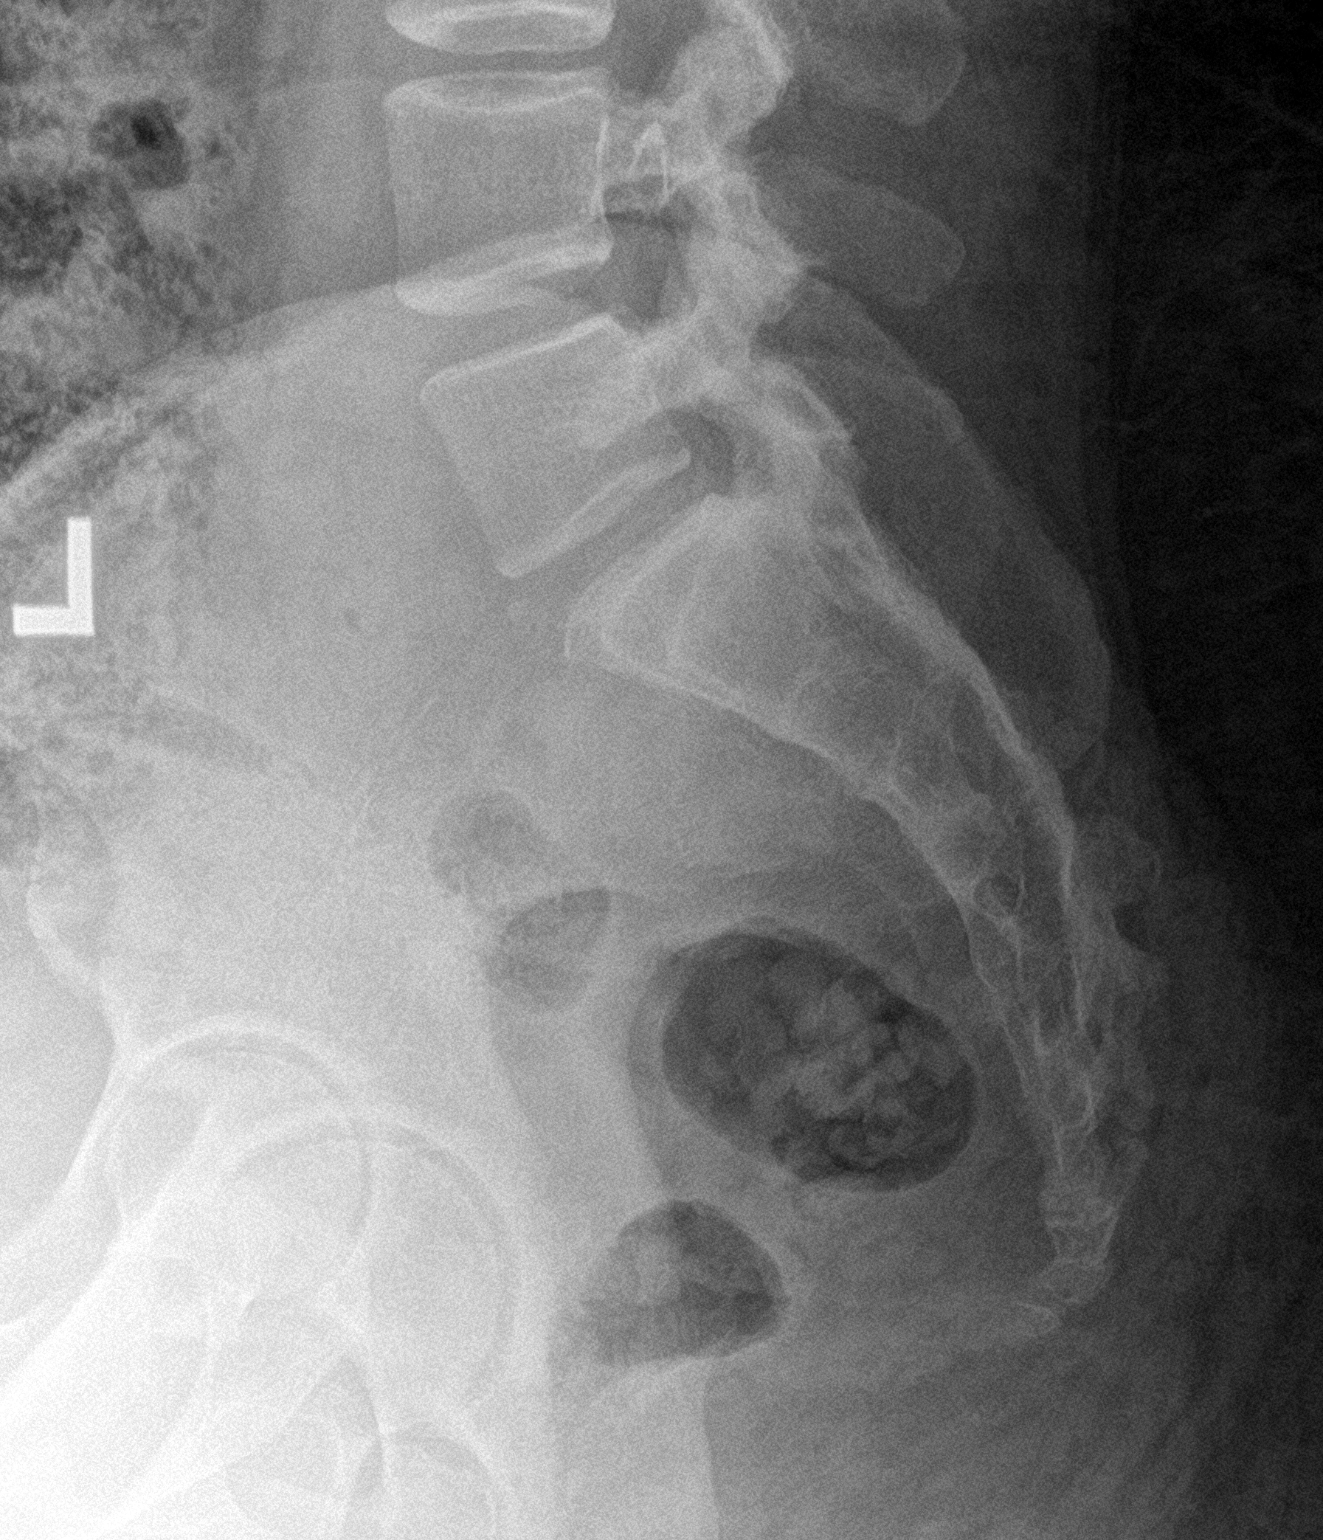

[3 of 3 positions shown; findings below may reference images not displayed]

FINDINGS: There is no evidence of fracture or other focal bone lesions.
IMPRESSION: Negative.

## 2020-12-26 MED ORDER — DM-GUAIFENESIN ER 30-600 MG PO TB12: 1.0000 | ORAL_TABLET | Freq: Two times a day (BID) | ORAL | Status: DC | PRN

## 2020-12-26 MED ORDER — LACTATED RINGERS IV BOLUS
1000.0000 mL | Freq: Once | INTRAVENOUS | Status: AC
  Administered 2020-12-26: 1000 mL via INTRAVENOUS

## 2020-12-26 MED ORDER — ENOXAPARIN SODIUM 40 MG/0.4ML ~~LOC~~ SOLN
40.0000 mg | SUBCUTANEOUS | Status: DC
  Filled 2020-12-26: qty 0.4

## 2020-12-26 MED ORDER — NAPROXEN 500 MG PO TABS
500.0000 mg | ORAL_TABLET | Freq: Once | ORAL | Status: AC
  Administered 2020-12-26: 500 mg via ORAL
  Filled 2020-12-26: qty 1

## 2020-12-26 MED ORDER — SODIUM CHLORIDE 0.9 % IV SOLN: INTRAVENOUS | Status: DC

## 2020-12-26 MED ORDER — ACETAMINOPHEN 500 MG PO TABS
1000.0000 mg | ORAL_TABLET | Freq: Once | ORAL | Status: AC
  Administered 2020-12-26: 1000 mg via ORAL
  Filled 2020-12-26: qty 2

## 2020-12-26 MED ORDER — ACETAMINOPHEN 325 MG PO TABS
650.0000 mg | ORAL_TABLET | Freq: Four times a day (QID) | ORAL | Status: DC | PRN
  Administered 2020-12-27: 650 mg via ORAL
  Filled 2020-12-26: qty 2

## 2020-12-26 MED ORDER — ONDANSETRON HCL 4 MG PO TABS: 4.0000 mg | ORAL_TABLET | Freq: Four times a day (QID) | ORAL | Status: DC | PRN

## 2020-12-26 MED ORDER — ALBUTEROL SULFATE HFA 108 (90 BASE) MCG/ACT IN AERS
2.0000 | INHALATION_SPRAY | RESPIRATORY_TRACT | Status: DC | PRN
  Filled 2020-12-26: qty 6.7

## 2020-12-26 MED ORDER — MAGNESIUM SULFATE IN D5W 1-5 GM/100ML-% IV SOLN
1.0000 g | Freq: Once | INTRAVENOUS | Status: AC
  Administered 2020-12-26: 1 g via INTRAVENOUS
  Filled 2020-12-26: qty 100

## 2020-12-26 MED ORDER — ASPIRIN EC 81 MG PO TBEC: 81.0000 mg | DELAYED_RELEASE_TABLET | Freq: Every day | ORAL | Status: DC

## 2020-12-26 MED ORDER — ASPIRIN 81 MG PO CHEW
324.0000 mg | CHEWABLE_TABLET | Freq: Once | ORAL | Status: AC
  Administered 2020-12-26: 324 mg via ORAL
  Filled 2020-12-26: qty 4

## 2020-12-26 MED ORDER — IOHEXOL 350 MG/ML SOLN
100.0000 mL | Freq: Once | INTRAVENOUS | Status: AC | PRN
  Administered 2020-12-26: 100 mL via INTRAVENOUS

## 2020-12-26 NOTE — H&P (Addendum)
History and Physical    Krista Rice QPY:[id number] DOB: 1986/07/25 DOA: 12/26/2020  Referring MD/NP/PA:   PCP: Patient, No Pcp Per   Patient coming from:  The patient is coming from working place.  At baseline, pt is independent for most of ADL.        Chief Complaint: syncope and palpitation  HPI: Krista Rice is a 35 y.o. female with medical history significant of self-reported COVID-19 infection on 12/10/20, who presents with syncope and palpitation.  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 This was witnessed although she is unsure how long she had passed out, but she estimated that she passed out for about 10 seconds. States that she had felt dizzy before the event. After she woke up, she had palpitation and felt anxious. He fell during the event and landed on her buttock, causing some pain in her sacral area.   States that she had positive Covid test in [place] on 12/10/2020.  She does not have fever, chills, shortness breath, chest pain.  She has minimal dry cough currently.  Patient does not have nausea, vomiting, diarrhea, abdominal pain, symptoms of UTI.  No unilateral numbness or tingling in extremities.  No facial droop or slurred speech. She denies illicit drug use but does endorse intermittent binge drinking including drinking several shots of liquor last night.  Per EMS pt was noted to be in SVT with HR 220-240s. Pt was given 6 mg of adenosine followed by 12 mg of adenosine x2, with conversion to sinus tachycardia.  Currently heart rate is 100-110s.   ED Course: pt was found to have trop 8 -->37 -->59, positive D-dimer 2592, WBC 7.7, TSH 0.855, electrolytes renal function okay, temperature normal, blood pressure 122/80, RR 23, 10, oxygen saturation 100% on room air.  Chest x-ray showed mild bronchitic change, CT head negative for acute intracranial abnormalities.  CT angiogram is negative for PE.  Negative x-ray for L-spine and  sacrum/coccyx. Pt is placed on progressive bed for observation.  Dr. Duke Salvia of cardiology is consulted    Review of Systems:   General: no fevers, chills, no body weight gain, has fatigue HEENT: no blurry vision, hearing changes or sore throat Respiratory: no dyspnea, has mild coughing, no wheezing CV: no chest pain, no palpitations GI: no nausea, vomiting, abdominal pain, diarrhea, constipation GU: no dysuria, burning on urination, increased urinary frequency, hematuria  Ext: no leg edema Neuro: no unilateral weakness, numbness, or tingling, no vision change or hearing loss. Has dizziness and syncope Skin: no rash, no skin tear. MSK: No muscle spasm, no deformity, no limitation of range of movement in spin Heme: No easy bruising.  Travel history: No recent long distant travel.  Allergy: No Known Allergies  History reviewed. No pertinent past medical history.  Past Surgical History:  Procedure Laterality Date  . CESAREAN SECTION      Social History:  reports that she has never smoked. She has never used smokeless tobacco. She reports current alcohol use. She reports that she does not use drugs.  Family History:  Family History  Problem Relation Age of Onset  . Diabetes Mother   . Hypertension Mother   . Hypertension Father   . Breast cancer Maternal Aunt   . Ovarian cancer Neg Hx   . Colon cancer Neg Hx      Prior to Admission medications   Medication Sig Start Date End Date Taking? Authorizing Provider  etonogestrel (  NEXPLANON) 68 MG IMPL implant 1 each by Subdermal route once.    [provider]    Physical Exam: Vitals:   12/26/20 0848 12/26/20 1000 12/26/20 1200 12/26/20 1230  BP: (!) 128/100 117/80 126/74 122/80  Pulse: (!) 106 99 94 85  Resp: 12 (!) 23 19 10   Temp: 98.2 F (36.8 C)     TempSrc: Oral     SpO2: 100% 100% 100% 100%  Weight:      Height:       General: Not in acute distress HEENT:       Eyes: PERRL, EOMI, no scleral icterus.        ENT: No discharge from the ears and nose, no pharynx injection, no tonsillar enlargement.        Neck: No JVD, no bruit, no mass felt. Heme: No neck lymph node enlargement. Cardiac: S1/S2, RRR, No murmurs, No gallops or rubs. Respiratory: No rales, wheezing, rhonchi or rubs. GI: Soft, nondistended, nontender, no rebound pain, no organomegaly, BS present. GU: No hematuria Ext: No pitting leg edema bilaterally. 1+DP/PT pulse bilaterally. Musculoskeletal: No joint deformities, No joint redness or warmth, no limitation of ROM in spin. Skin: No rashes.  Neuro: Alert, oriented X3, cranial nerves II-XII grossly intact, moves all extremities normally.  Psych: Patient is not psychotic, no suicidal or hemocidal ideation.  Labs on Admission: I have personally reviewed following labs and imaging studies  CBC: Recent Labs  Lab 12/26/20 0844  WBC 7.7  NEUTROABS 4.2  HGB 13.5  HCT 40.3  MCV 87.8  PLT 247   Basic Metabolic Panel: Recent Labs  Lab 12/26/20 0844  NA 139  K 3.6  CL 109  CO2 19*  GLUCOSE 91  BUN 11  CREATININE 0.86  CALCIUM 7.8*  MG 1.9   GFR: Estimated Creatinine Clearance: 149.1 mL/min (by C-G formula based on SCr of 0.86 mg/dL). Liver Function Tests: Recent Labs  Lab 12/26/20 0844  AST 33  ALT 32  ALKPHOS 47  BILITOT 0.7  PROT 7.2  ALBUMIN 3.6   No results for input(s): LIPASE, AMYLASE in the last 168 hours. No results for input(s): AMMONIA in the last 168 hours. Coagulation Profile: No results for input(s): INR, PROTIME in the last 168 hours. Cardiac Enzymes: No results for input(s): CKTOTAL, CKMB, CKMBINDEX, TROPONINI in the last 168 hours. BNP (last 3 results) No results for input(s): PROBNP in the last 8760 hours. HbA1C: No results for input(s): HGBA1C in the last 72 hours. CBG: No results for input(s): GLUCAP in the last 168 hours. Lipid Profile: No results for input(s): CHOL, HDL, LDLCALC, TRIG, CHOLHDL, LDLDIRECT in the last 72  hours. Thyroid Function Tests: Recent Labs    12/26/20 0844  TSH 0.855   Anemia Panel: No results for input(s): VITAMINB12, FOLATE, FERRITIN, TIBC, IRON, RETICCTPCT in the last 72 hours. Urine analysis: No results found for: COLORURINE, APPEARANCEUR, LABSPEC, PHURINE, GLUCOSEU, HGBUR, BILIRUBINUR, KETONESUR, PROTEINUR, UROBILINOGEN, NITRITE, LEUKOCYTESUR Sepsis Labs: @LABRCNTIP (procalcitonin:4,lacticidven:4) )No results found for this or any previous visit (from the past 240 hour(s)).   Radiological Exams on Admission: DG Chest 2 View  Result Date: 12/26/2020 CLINICAL DATA:  Syncopal episode of work EXAM: CHEST - 2 VIEW COMPARISON:  05/23/2009 FINDINGS: Normal heart size, mediastinal contours, and pulmonary vascularity. Minimal peribronchial thickening. Lungs clear. No infiltrate, pleural effusion, or pneumothorax. Osseous structures unremarkable. IMPRESSION: Minimal bronchitic changes without infiltrate. Electronically Signed   By: Ulyses Southward M.D.   On: 12/26/2020 09:13   DG Lumbar  Spine 2-3 Views  Result Date: 12/26/2020 CLINICAL DATA:  Syncope with fall.  Pain. EXAM: LUMBAR SPINE - 2-3 VIEW COMPARISON:  None. FINDINGS: There is no evidence of lumbar spine fracture. Alignment is within normal limits, perhaps minimal levoscoliosis which may be related to patient positioning. Intervertebral disc spaces are maintained. Visualized paravertebral soft tissues are unremarkable. IMPRESSION: Negative. Electronically Signed   By: Bary Richard M.D.   On: 12/26/2020 11:22   DG Sacrum/Coccyx  Result Date: 12/26/2020 CLINICAL DATA:  Syncope and fall.  Pain. EXAM: SACRUM AND COCCYX - 2+ VIEW COMPARISON:  None. FINDINGS: There is no evidence of fracture or other focal bone lesions. IMPRESSION: Negative. Electronically Signed   By: Bary Richard M.D.   On: 12/26/2020 11:23   CT Head Wo Contrast  Result Date: 12/26/2020 CLINICAL DATA:  Dizziness at work with subsequent syncope. EXAM: CT HEAD WITHOUT  CONTRAST TECHNIQUE: Contiguous axial images were obtained from the base of the skull through the vertex without intravenous contrast. COMPARISON:  None. FINDINGS: Brain: No evidence of acute infarction, hemorrhage, hydrocephalus, extra-axial collection or mass lesion/mass effect. Vascular: No hyperdense vessel or unexpected calcification. Skull: Normal. Negative for fracture or focal lesion. Sinuses/Orbits: No acute finding. IMPRESSION: Negative head CT. Electronically Signed   By: Marnee Spring M.D.   On: 12/26/2020 09:19   CT Angio Chest PE W and/or Wo Contrast  Result Date: 12/26/2020 CLINICAL DATA:  35 year old female with a recent history of COVID and new onset dizziness with elevated D-dimer. High probability for PE. EXAM: CT ANGIOGRAPHY CHEST WITH CONTRAST TECHNIQUE: Multidetector CT imaging of the chest was performed using the standard protocol during bolus administration of intravenous contrast. Multiplanar CT image reconstructions and MIPs were obtained to evaluate the vascular anatomy. CONTRAST:  OMNIPAQUE IOHEXOL 350 MG/ML SOLN COMPARISON:  None. FINDINGS: Cardiovascular: Satisfactory opacification of the pulmonary arteries to the segmental level. No evidence of pulmonary embolism. Normal heart size. No pericardial effusion. Mediastinum/Nodes: Unremarkable CT appearance of the thyroid gland. No suspicious mediastinal or hilar adenopathy. No soft tissue mediastinal mass. Small hiatal hernia. Lungs/Pleura: Lungs are clear. No pleural effusion or pneumothorax. Upper Abdomen: No acute abnormality. Musculoskeletal: No chest wall abnormality. No acute or significant osseous findings. Review of the MIP images confirms the above findings. IMPRESSION: Negative for acute pulmonary embolus, pneumonia or other acute cardiopulmonary process. Small hiatal hernia. Electronically Signed   By: Malachy Moan M.D.   On: 12/26/2020 11:40     EKG: I have personally reviewed.  Sinus rhythm, QTC 414, early  R wave progression, nonspecific T wave change  Assessment/Plan Principal Problem:   Syncope Active Problems:   SVT (supraventricular tachycardia) (HCC)   Elevated troponin   COVID-19 virus infection   Syncope due to SVT: Patient's syncope is likely due to SVT.  Per report, patient has SVT with heart rate at 220-240, converted to sinus rhythm after treated with adenosine.  Currently heart rate is 100-110s.  Patient denies any shortness breath or chest pain.  No focal neuro deficit on physical examination.  CT head is negative for acute intracranial abnormalities.  Patient had positive D-dimer 2592, but CT angiogram is negative for PE.  TSH 0.855, Dr. Duke Salvia of card is consulted.  Magnesium 1.9, potassium 3.6  - Place on tele bed for obs - Orthostatic vital signs  -Frequent neuro checks  - IVF: 1L of LR, then NS 75 cc/h - tele monitoring - f/u Dr. Leonides Sake recommendations. - Give 1 g magnesium sulfate due to  keep magnesium level above 2 - 2d echo per card - LE doppler to r/o DVT.  Elevated troponin: trop 8 -->37 -->59. No CP.  Likely due to demand ischemia secondary to SVT. -ASA -trending trop -check UDS, A1c and FLP  COVID-19 virus infection: pt had positive covid19 test on 12/10/20. Currently has minimal dry cough, no shortness breath or chest pain oxygen saturation 100% on room air.  Chest x-ray with minimal bronchitic change, without infiltration.  Patient does not need further treatment. -As needed Mucinex and albuterol inhaler -No isolation needed (Covid19 PCR is ordered by EDP, but no isolation needed regardless of results)    DVT ppx:  SQ Lovenox Code Status: Full code Family Communication: not done, no family member is at bed side.     Disposition Plan:  Anticipate discharge back to previous environment Consults called: Dr. Duke Salvia of cardiology Admission status and Level of care: Progressive Cardiac:  obs    Status is: Observation  The patient remains OBS  appropriate and will d/c before 2 midnights.  Dispo: The patient is from: Home              Anticipated d/c is to: Home              Anticipated d/c date is: 1 day              Patient currently is not medically stable to d/c.   Difficult to place patient No           Date of Service 12/26/2020    Lorretta Harp [facility]   If 7PM-7AM, please contact night-coverage [email or web address] 12/26/2020, 2:21 PM

## 2020-12-26 NOTE — ED Notes (Signed)
Pt taken to Xray at this time.

## 2020-12-26 NOTE — ED Notes (Signed)
IV site changed per patient request, orthostatic VS obtained by this RN, patient tolerated well. Pt back to bed, lights dimmed per request, call bell within reach, no further needs noted at this time.

## 2020-12-26 NOTE — ED Provider Notes (Signed)
[facility] Emergency Department Provider Note  ____________________________________________   Event Date/Time   First MD Initiated Contact with Patient 12/26/20 [phone]     (approximate)  I have reviewed the triage vital signs and the nursing notes.   HISTORY  Chief Complaint Loss of Consciousness and Tachycardia   HPI [name] is a 35 y.o. 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  Patient remembers feeling dizzy while helping a customer and then passing out.  This was witnessed although she is unsure how long she had loss of consciousness for and if she hit her head or any other part of her body.  She states that when she woke up she had palpitations and felt anxious but never had any pain including in her head, neck, chest, abdomen, back or extremities.  Per EMS patient was noted to be in SVT with a rate in the 200s and received 6 mg followed by 12 mg followed by 12 mg of adenosine with conversion to sinus tach in the 110s after the third dose.  Reports very mild palpitations on arrival to the ED but otherwise denies any acute symptoms.  She states she is otherwise in her usual state health without any recent fevers, chills, cough, chest pain, vomiting, diarrhea, dysuria, rash, back pain, focal extremity pain weakness numbness or tingling, vertigo, vision changes or any other sick symptoms.  No recent falls or injuries.  No prior similar episodes.  He denies tobacco abuse or illicit drug use but does endorse intermittent binge drinking including drinking several shots of liquor last night.         History reviewed. No pertinent past medical history.  Patient Active Problem List   Diagnosis Date Noted  . Syncope 12/26/2020  . SVT (supraventricular tachycardia) (HCC) 12/26/2020  . Elevated troponin 12/26/2020  . COVID-19 virus infection  12/26/2020  . Positive D dimer   . Family history of breast cancer in female 09/01/2019  . Cough 07/08/2019  . Sore throat 07/08/2019    Past Surgical History:  Procedure Laterality Date  . CESAREAN SECTION      Prior to Admission medications   Medication Sig Start Date End Date Taking? Authorizing Provider  DM-Doxylamine-Acetaminophen (NYQUIL COLD & FLU PO) Take 5 mLs by mouth every 6 (six) hours as needed (cough).   Yes [provider]  etonogestrel (NEXPLANON) 68 MG IMPL implant 1 each by Subdermal route once.   Yes [provider]  guaiFENesin (MUCINEX) 600 MG 12 hr tablet Take 600 mg by mouth daily as needed for cough.   Yes [provider]    Allergies Patient has no known allergies.  Family History  Problem Relation Age of Onset  . Diabetes Mother   . Hypertension Mother   . Hypertension Father   . Breast cancer Maternal Aunt   . Ovarian cancer Neg Hx   . Colon cancer Neg Hx     Social History Social History   Tobacco Use  . Smoking status: Never Smoker  . Smokeless tobacco: Never Used  Vaping Use  . Vaping Use: Never used  Substance Use Topics  . Alcohol use: Yes    Comment: socially  . Drug use: Never    Review of Systems  Review of Systems  Constitutional: Negative for chills and fever.  HENT: Negative for sore throat.   Eyes: Negative for  pain.  Respiratory: Negative for cough and stridor.   Cardiovascular: Positive for palpitations. Negative for chest pain.  Gastrointestinal: Negative for vomiting.  Genitourinary: Negative for dysuria.  Musculoskeletal: Positive for back pain ( lower back where pt thinks she sat down hard). Negative for myalgias.  Skin: Negative for rash.  Neurological: Positive for dizziness and loss of consciousness. Negative for seizures and headaches.  Psychiatric/Behavioral: Negative for suicidal ideas. The patient is nervous/anxious.   All other systems reviewed and are negative.      ____________________________________________   PHYSICAL EXAM:  VITAL SIGNS: ED Triage Vitals [12/26/20 0842]  Enc Vitals Group     BP      Pulse      Resp      Temp      Temp src      SpO2      Weight (!) 325 lb 6.4 oz (147.6 kg)     Height 5\' 11"  (1.803 m)     Head Circumference      Peak Flow      Pain Score 7     Pain Loc      Pain Edu?      Excl. in GC?    Vitals:   12/26/20 1200 12/26/20 1230  BP: 126/74 122/80  Pulse: 94 85  Resp: 19 10  Temp:    SpO2: 100% 100%   Physical Exam Vitals and nursing note reviewed.  Constitutional:      General: She is not in acute distress.    Appearance: She is well-developed and well-nourished.  HENT:     Head: Normocephalic and atraumatic.     Right Ear: External ear normal.     Left Ear: External ear normal.     Nose: Nose normal.     Mouth/Throat:     Mouth: Mucous membranes are moist.  Eyes:     Conjunctiva/sclera: Conjunctivae normal.  Cardiovascular:     Rate and Rhythm: Normal rate and regular rhythm.     Heart sounds: No murmur heard.   Pulmonary:     Effort: Pulmonary effort is normal. No respiratory distress.     Breath sounds: Normal breath sounds.  Abdominal:     Palpations: Abdomen is soft.     Tenderness: There is no abdominal tenderness.  Musculoskeletal:        General: No edema.     Cervical back: Neck supple.  Skin:    General: Skin is warm and dry.     Capillary Refill: Capillary refill takes less than 2 seconds.  Neurological:     Mental Status: She is alert and oriented to person, place, and time.  Psychiatric:        Mood and Affect: Mood and affect and mood normal.     No tenderness step-offs or deformities over the C/T/ spine.  Some mild tenderness over the L-spine.  Patient has full and symmetric strength of all extremities.  Sensation is intact to light touch of all extremities.  No evidence of trauma to the bilateral upper or lower extremities.  Able with bilateral radial and DP  pulses.  Cranial nerves II through XII grossly intact.  No evidence of trauma to the face scalp head or neck. ____________________________________________   LABS (all labs ordered are listed, but only abnormal results are displayed)  Labs Reviewed  COMPREHENSIVE METABOLIC PANEL - Abnormal; Notable for the following components:      Result Value   CO2 19 (*)    Calcium  7.8 (*)    All other components within normal limits  FIBRIN DERIVATIVES D-DIMER ([facility] ONLY) - Abnormal; Notable for the following components:   Fibrin derivatives D-dimer [facility]) 2,592.87 (*)    All other components within normal limits  TROPONIN I (HIGH SENSITIVITY) - Abnormal; Notable for the following components:   Troponin I (High Sensitivity) 37 (*)    All other components within normal limits  TROPONIN I (HIGH SENSITIVITY) - Abnormal; Notable for the following components:   Troponin I (High Sensitivity) 59 (*)    All other components within normal limits  TROPONIN I (HIGH SENSITIVITY) - Abnormal; Notable for the following components:   Troponin I (High Sensitivity) 70 (*)    All other components within normal limits  SARS CORONAVIRUS 2 (TAT 6-24 HRS)  CBC WITH DIFFERENTIAL/PLATELET  HCG, QUANTITATIVE, PREGNANCY  TSH  MAGNESIUM  RAPID URINE DRUG SCREEN, HOSP PERFORMED  HIV ANTIBODY (ROUTINE TESTING W REFLEX)  POC URINE PREG, ED  TROPONIN I (HIGH SENSITIVITY)  TROPONIN I (HIGH SENSITIVITY)   ____________________________________________  EKG  Sinus rhythm with a ventricular rate of 100, normal axis, unremarkable intervals, no evidence of acute ischemia or significant underlying arrhythmia. ____________________________________________  RADIOLOGY  ED MD interpretation: Chest x-ray has no evidence of pneumonia pneumothorax, full consolidation or other clear acute intrathoracic process.  Plain films of the patient's L-spine and coccyx unremarkable for fracture dislocation.  CT head shows no evidence of  intracranial hemorrhage skull fracture or other acute intracranial process.  CTA shows no evidence of PE or other acute intrathoracic process.  Official radiology report(s): DG Chest 2 View  Result Date: 12/26/2020 CLINICAL DATA:  Syncopal episode of work EXAM: CHEST - 2 VIEW COMPARISON:  05/23/2009 FINDINGS: Normal heart size, mediastinal contours, and pulmonary vascularity. Minimal peribronchial thickening. Lungs clear. No infiltrate, pleural effusion, or pneumothorax. Osseous structures unremarkable. IMPRESSION: Minimal bronchitic changes without infiltrate. Electronically Signed   By: Ulyses Southward M.D.   On: 12/26/2020 09:13   DG Lumbar Spine 2-3 Views  Result Date: 12/26/2020 CLINICAL DATA:  Syncope with fall.  Pain. EXAM: LUMBAR SPINE - 2-3 VIEW COMPARISON:  None. FINDINGS: There is no evidence of lumbar spine fracture. Alignment is within normal limits, perhaps minimal levoscoliosis which may be related to patient positioning. Intervertebral disc spaces are maintained. Visualized paravertebral soft tissues are unremarkable. IMPRESSION: Negative. Electronically Signed   By: Bary Richard M.D.   On: 12/26/2020 11:22   DG Sacrum/Coccyx  Result Date: 12/26/2020 CLINICAL DATA:  Syncope and fall.  Pain. EXAM: SACRUM AND COCCYX - 2+ VIEW COMPARISON:  None. FINDINGS: There is no evidence of fracture or other focal bone lesions. IMPRESSION: Negative. Electronically Signed   By: Bary Richard M.D.   On: 12/26/2020 11:23   CT Head Wo Contrast  Result Date: 12/26/2020 CLINICAL DATA:  Dizziness at work with subsequent syncope. EXAM: CT HEAD WITHOUT CONTRAST TECHNIQUE: Contiguous axial images were obtained from the base of the skull through the vertex without intravenous contrast. COMPARISON:  None. FINDINGS: Brain: No evidence of acute infarction, hemorrhage, hydrocephalus, extra-axial collection or mass lesion/mass effect. Vascular: No hyperdense vessel or unexpected calcification. Skull: Normal. Negative  for fracture or focal lesion. Sinuses/Orbits: No acute finding. IMPRESSION: Negative head CT. Electronically Signed   By: Marnee Spring M.D.   On: 12/26/2020 09:19   CT Angio Chest PE W and/or Wo Contrast  Result Date: 12/26/2020 CLINICAL DATA:  35 year old female with a recent history of COVID and new onset dizziness with elevated  D-dimer. High probability for PE. EXAM: CT ANGIOGRAPHY CHEST WITH CONTRAST TECHNIQUE: Multidetector CT imaging of the chest was performed using the standard protocol during bolus administration of intravenous contrast. Multiplanar CT image reconstructions and MIPs were obtained to evaluate the vascular anatomy. CONTRAST:  OMNIPAQUE IOHEXOL 350 MG/ML SOLN COMPARISON:  None. FINDINGS: Cardiovascular: Satisfactory opacification of the pulmonary arteries to the segmental level. No evidence of pulmonary embolism. Normal heart size. No pericardial effusion. Mediastinum/Nodes: Unremarkable CT appearance of the thyroid gland. No suspicious mediastinal or hilar adenopathy. No soft tissue mediastinal mass. Small hiatal hernia. Lungs/Pleura: Lungs are clear. No pleural effusion or pneumothorax. Upper Abdomen: No acute abnormality. Musculoskeletal: No chest wall abnormality. No acute or significant osseous findings. Review of the MIP images confirms the above findings. IMPRESSION: Negative for acute pulmonary embolus, pneumonia or other acute cardiopulmonary process. Small hiatal hernia. Electronically Signed   By: Malachy Moan M.D.   On: 12/26/2020 11:40   US Venous Img Lower Bilateral (DVT)  Result Date: 12/26/2020 CLINICAL DATA:  Positive D-dimer. EXAM: BILATERAL LOWER EXTREMITY VENOUS DOPPLER ULTRASOUND TECHNIQUE: Gray-scale sonography with compression, as well as color and duplex ultrasound, were performed to evaluate the deep venous system(s) from the level of the common femoral vein through the popliteal and proximal calf veins. COMPARISON:  None. FINDINGS: VENOUS Normal  compressibility of the common femoral, superficial femoral, and popliteal veins, as well as the visualized calf veins. Visualized portions of profunda femoral vein and great saphenous vein unremarkable. No filling defects to suggest DVT on grayscale or color Doppler imaging. Doppler waveforms show normal direction of venous flow, normal respiratory plasticity and response to augmentation. Limited views of the contralateral common femoral vein are unremarkable. OTHER None. Limitations: none IMPRESSION: Negative.  No evidence of DVT within either lower extremity. Electronically Signed   By: Bary Richard M.D.   On: 12/26/2020 15:03    ____________________________________________   PROCEDURES  Procedure(s) performed (including Critical Care):  .1-3 Lead EKG Interpretation Performed by: Gilles Chiquito, MD Authorized by: Gilles Chiquito, MD     Interpretation: normal     ECG rate assessment: normal     Rhythm: sinus rhythm     Ectopy: none     Conduction: normal       ____________________________________________   INITIAL IMPRESSION / ASSESSMENT AND PLAN / ED COURSE      Patient presents with above-stated history and exam after witnessed syncopal episode at her place of work in the setting of recent binge drinking last night associated with SVT that converted to sinus rhythm after above-noted adenosine dosages.  On arrival patient is slightly tachycardic with otherwise stable vital signs on room air.  She has a nonfocal neuro exam and no evidence of trauma on exam.   Differential includes but is not limited to syncope related to arrhythmia i.e. SVT noted by EMS, PE, ACS, pneumothorax, symptomatic anemia, metabolic derangements, orthostasis from dehydration from recent binge drinking, hypothyroidism and others.  No evidence of significant trauma on exam and CT head shows no evidence of skull fracture or intracranial hemorrhage.  No focal deficits to suggest CVA.  No findings on history  exam to suggest acute infectious process.  D-dimer is elevated at 2500 and CTA chest obtained to assess for PE.  No evidence of PE pneumothorax pneumonia or other clear acute intrathoracic process.  TSH and magnesium WNL.  Initial troponin is 8 this did increase to the 59 over several hours.  Low suspicion for coronary thrombosis  and expect this may be related to some demand ischemia although we will plan admit to medicine service for observation.   ____________________________________________   FINAL CLINICAL IMPRESSION(S) / ED DIAGNOSES  Final diagnoses:  Syncope, unspecified syncope type  SVT (supraventricular tachycardia) (HCC)  Troponin I above reference range  Positive D dimer    Medications  aspirin EC tablet 81 mg (has no administration in time range)  ondansetron (ZOFRAN) tablet 4 mg (has no administration in time range)  acetaminophen (TYLENOL) tablet 650 mg (has no administration in time range)  0.9 %  sodium chloride infusion (has no administration in time range)  enoxaparin (LOVENOX) injection 40 mg (has no administration in time range)  magnesium sulfate IVPB 1 g 100 mL (1 g Intravenous New Bag/Given 12/26/20 1518)  dextromethorphan-guaiFENesin (MUCINEX DM) 30-600 MG per 12 hr tablet 1 tablet (has no administration in time range)  albuterol (VENTOLIN HFA) 108 (90 Base) MCG/ACT inhaler 2 puff (has no administration in time range)  acetaminophen (TYLENOL) tablet 1,000 mg (1,000 mg Oral Given 12/26/20 1029)  naproxen (NAPROSYN) tablet 500 mg (500 mg Oral Given 12/26/20 1030)  iohexol (OMNIPAQUE) 350 MG/ML injection 100 mL (100 mLs Intravenous Contrast Given 12/26/20 1123)  lactated ringers bolus 1,000 mL (1,000 mLs Intravenous New Bag/Given 12/26/20 1221)  aspirin chewable tablet 324 mg (324 mg Oral Given 12/26/20 1333)     ED Discharge Orders    None       Note:  This document was prepared using Dragon voice recognition software and may include unintentional dictation  errors.   Gilles ChiquitoSmith, Chele Cornell P, MD 12/26/20 [phone]

## 2020-12-26 NOTE — ED Triage Notes (Signed)
Pt to ED via [facility] with initial c/o syncope while at work, per EMS pt awoke feeling like she was having a panic attack. Per EMS initial HR 120, once on the monitor HR 220-240 SVT, per EMS 6mg  Adenosine and 12mg  Adenosine without change, 2nd dose of 12mg  Adenosine HR back to 105 ST. Per EMS pt denies SOB, CP, nausea.   Pt arrives A&O x4.

## 2020-12-26 NOTE — Consult Note (Signed)
Cardiology Consultation:   Patient ID: Krista Rice MRN: [id number]; DOB: [birth year]  Admit date: 12/26/2020 Date of Consult: 12/26/2020  Primary Care Provider: Patient, No Pcp Per [facility] Cardiologist: No primary care provider on file. [facility] Electrophysiologist:  None    Patient Profile:   Krista Rice is a 35 y.o. female with a hx of obesity who is being seen today for the evaluation of SVT and syncope at the request of Dr. Roda Shutters.  History of Present Illness:   Ms. Krista Rice was diagnosed with COVID-19 12/10/20.  She initially had cough and felt poorly.  Lately she still has a mild cough but otherwise feels pretty well.  She has been working at her hotel for long hours.  Recently due to the snow she has been mostly living at hotel.  She has not been eating or drinking well.  Yesterday she had [employer] for breakfast and wings for dinner.  She did not have any other food.  She did not have much to drink other than a soda and a few shots of alcohol last night.  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  She had no preceding chest pain, palpitations, or shortness of breath.  She passed out and was unconscious for approximately 10 seconds.  When she awakened she recalls feeling palpitations and as though she were having a panic attack.  EMS was called and she was in SVT at a rate of 223 bpm.  She received 6 mg of adenosine without breaking the rhythm.  After her second dose of 12 mg she converted to sinus tachycardia in the 110s.  In the ED her blood counts were normal.  Renal function and electrolytes were normal with the exception of calcium at 7.8.  Thyroid function was normal and pregnancy test was negative.  High sensitivity troponin rose from 8-->37-->59.  Chest x-ray is unremarkable, as was her head CT.  Chest CT-A was unremarkable.  She was admitted to the hospitalist service and cardiology was consulted for further evaluation and  management.  She notes that at baseline she does not get much exercise but generally has no exertional chest pain or shortness of breath.  She denies any lower extremity edema, orthopnea, or PND.  She has no family history of any cardiac issues.   History reviewed. No pertinent past medical history.  Past Surgical History:  Procedure Laterality Date  . CESAREAN SECTION       Home Medications:  Prior to Admission medications   Medication Sig Start Date End Date Taking? Authorizing Provider  DM-Doxylamine-Acetaminophen (NYQUIL COLD & FLU PO) Take 5 mLs by mouth every 6 (six) hours as needed (cough).   Yes [provider]  etonogestrel (NEXPLANON) 68 MG IMPL implant 1 each by Subdermal route once.   Yes [provider]  guaiFENesin (MUCINEX) 600 MG 12 hr tablet Take 600 mg by mouth daily as needed for cough.   Yes [provider]    Inpatient Medications: Scheduled Meds: . [START ON 12/27/2020] aspirin EC  81 mg Oral Daily  . enoxaparin (LOVENOX) injection  40 mg Subcutaneous Q24H   Continuous Infusions: . sodium chloride    . magnesium sulfate bolus IVPB     PRN Meds: acetaminophen, albuterol, dextromethorphan-guaiFENesin, ondansetron  Allergies:   No Known Allergies  Social History:   Social History   Socioeconomic History  . Marital status: Single    Spouse name: Not on file  .  Number of children: Not on file  . Years of education: Not on file  . Highest education level: Not on file  Occupational History  . Not on file  Tobacco Use  . Smoking status: Never Smoker  . Smokeless tobacco: Never Used  Vaping Use  . Vaping Use: Never used  Substance and Sexual Activity  . Alcohol use: Yes    Comment: socially  . Drug use: Never  . Sexual activity: Yes    Birth control/protection: Implant, Condom  Other Topics Concern  . Not on file  Social History Narrative  . Not on file   Social Determinants of Health   Financial Resource Strain:  Not on file  Food Insecurity: Not on file  Transportation Needs: Not on file  Physical Activity: Not on file  Stress: Not on file  Social Connections: Not on file  Intimate Partner Violence: Not on file    Family History:    Family History  Problem Relation Age of Onset  . Diabetes Mother   . Hypertension Mother   . Hypertension Father   . Breast cancer Maternal Aunt   . Ovarian cancer Neg Hx   . Colon cancer Neg Hx      ROS:  Please see the history of present illness.  All other ROS reviewed and negative.     Physical Exam/Data:   Vitals:   12/26/20 0848 12/26/20 1000 12/26/20 1200 12/26/20 1230  BP: (!) 128/100 117/80 126/74 122/80  Pulse: (!) 106 99 94 85  Resp: 12 (!) 23 19 10   Temp: 98.2 F (36.8 C)     TempSrc: Oral     SpO2: 100% 100% 100% 100%  Weight:      Height:       No intake or output data in the 24 hours ending 12/26/20 1421 Last 3 Weights 12/26/2020 09/02/2020 09/01/2019  Weight (lbs) 325 lb 6.4 oz 325 lb 4.8 oz 338 lb 1.6 oz  Weight (kg) 147.6 kg 147.555 kg 153.361 kg     VS:  BP 122/80   Pulse 85   Temp 98.2 F (36.8 C) (Oral)   Resp 10   Ht 5\' 11"  (1.803 m)   Wt (!) 147.6 kg   SpO2 100%   BMI 45.38 kg/m  , BMI Body mass index is 45.38 kg/m. GENERAL:  Well appearing HEENT: Pupils equal round and reactive, fundi not visualized, oral mucosa unremarkable NECK:  No jugular venous distention, waveform within normal limits, carotid upstroke brisk and symmetric, no bruits LUNGS:  Clear to auscultation bilaterally HEART:  RRR.  PMI not displaced or sustained,S1 and S2 within normal limits, no S3, no S4, no clicks, no rubs, no murmurs ABD:  Flat, positive bowel sounds normal in frequency in pitch, no bruits, no rebound, no guarding, no midline pulsatile mass, no hepatomegaly, no splenomegaly EXT:  2 plus pulses throughout, no edema, no cyanosis no clubbing SKIN:  No rashes no nodules NEURO:  Cranial nerves II through XII grossly intact, motor  grossly intact throughout PSYCH:  Cognitively intact, oriented to person place and time   EKG:  The EKG was personally reviewed and demonstrates:  Sinus tachycardia.  Rate 100 bpm.  Telemetry:  Telemetry was personally reviewed and demonstrates:  Sinus rhythm.  No events  Relevant CV Studies: Echo pending  Laboratory Data:  High Sensitivity Troponin:   Recent Labs  Lab 12/26/20 0844 12/26/20 1049 12/26/20 1222 12/26/20 1339  TROPONINIHS 8 37* 59* 70*     Chemistry  Recent Labs  Lab 12/26/20 0844  NA 139  K 3.6  CL 109  CO2 19*  GLUCOSE 91  BUN 11  CREATININE 0.86  CALCIUM 7.8*  GFRNONAA >60  ANIONGAP 11    Recent Labs  Lab 12/26/20 0844  PROT 7.2  ALBUMIN 3.6  AST 33  ALT 32  ALKPHOS 47  BILITOT 0.7   Hematology Recent Labs  Lab 12/26/20 0844  WBC 7.7  RBC 4.59  HGB 13.5  HCT 40.3  MCV 87.8  MCH 29.4  MCHC 33.5  RDW 13.2  PLT 247   BNPNo results for input(s): BNP, PROBNP in the last 168 hours.  DDimer No results for input(s): DDIMER in the last 168 hours.   Radiology/Studies:  DG Chest 2 View  Result Date: 12/26/2020 CLINICAL DATA:  Syncopal episode of work EXAM: CHEST - 2 VIEW COMPARISON:  05/23/2009 FINDINGS: Normal heart size, mediastinal contours, and pulmonary vascularity. Minimal peribronchial thickening. Lungs clear. No infiltrate, pleural effusion, or pneumothorax. Osseous structures unremarkable. IMPRESSION: Minimal bronchitic changes without infiltrate. Electronically Signed   By: Ulyses Southward M.D.   On: 12/26/2020 09:13   DG Lumbar Spine 2-3 Views  Result Date: 12/26/2020 CLINICAL DATA:  Syncope with fall.  Pain. EXAM: LUMBAR SPINE - 2-3 VIEW COMPARISON:  None. FINDINGS: There is no evidence of lumbar spine fracture. Alignment is within normal limits, perhaps minimal levoscoliosis which may be related to patient positioning. Intervertebral disc spaces are maintained. Visualized paravertebral soft tissues are unremarkable. IMPRESSION:  Negative. Electronically Signed   By: Bary Richard M.D.   On: 12/26/2020 11:22   DG Sacrum/Coccyx  Result Date: 12/26/2020 CLINICAL DATA:  Syncope and fall.  Pain. EXAM: SACRUM AND COCCYX - 2+ VIEW COMPARISON:  None. FINDINGS: There is no evidence of fracture or other focal bone lesions. IMPRESSION: Negative. Electronically Signed   By: Bary Richard M.D.   On: 12/26/2020 11:23   CT Head Wo Contrast  Result Date: 12/26/2020 CLINICAL DATA:  Dizziness at work with subsequent syncope. EXAM: CT HEAD WITHOUT CONTRAST TECHNIQUE: Contiguous axial images were obtained from the base of the skull through the vertex without intravenous contrast. COMPARISON:  None. FINDINGS: Brain: No evidence of acute infarction, hemorrhage, hydrocephalus, extra-axial collection or mass lesion/mass effect. Vascular: No hyperdense vessel or unexpected calcification. Skull: Normal. Negative for fracture or focal lesion. Sinuses/Orbits: No acute finding. IMPRESSION: Negative head CT. Electronically Signed   By: Marnee Spring M.D.   On: 12/26/2020 09:19   CT Angio Chest PE W and/or Wo Contrast  Result Date: 12/26/2020 CLINICAL DATA:  35 year old female with a recent history of COVID and new onset dizziness with elevated D-dimer. High probability for PE. EXAM: CT ANGIOGRAPHY CHEST WITH CONTRAST TECHNIQUE: Multidetector CT imaging of the chest was performed using the standard protocol during bolus administration of intravenous contrast. Multiplanar CT image reconstructions and MIPs were obtained to evaluate the vascular anatomy. CONTRAST:  OMNIPAQUE IOHEXOL 350 MG/ML SOLN COMPARISON:  None. FINDINGS: Cardiovascular: Satisfactory opacification of the pulmonary arteries to the segmental level. No evidence of pulmonary embolism. Normal heart size. No pericardial effusion. Mediastinum/Nodes: Unremarkable CT appearance of the thyroid gland. No suspicious mediastinal or hilar adenopathy. No soft tissue mediastinal mass. Small hiatal  hernia. Lungs/Pleura: Lungs are clear. No pleural effusion or pneumothorax. Upper Abdomen: No acute abnormality. Musculoskeletal: No chest wall abnormality. No acute or significant osseous findings. Review of the MIP images confirms the above findings. IMPRESSION: Negative for acute pulmonary embolus, pneumonia or other acute  cardiopulmonary process. Small hiatal hernia. Electronically Signed   By: Malachy Moan M.D.   On: 12/26/2020 11:40     Assessment and Plan:   # SVT: # Syncope: Ms. Lenig had a syncopal episode.  The initial event sounds orthostatic.  Her oral intake has been poor recently and she had minimal fluids yesterday.  She also did drink alcohol.  It does not sound as though there are any preceding palpitations and I suspect SVT was not the initial cause of her syncope.  However after she did have documented SVT.  This has not been a history issue for her in the past.  There are no significant electrolyte or thyroid abnormalities.  Agree with hydrating with IV fluids.  Check orthostatic vital signs.  We will get an echocardiogram to ensure that there is no evidence of structural heart disease or myocarditis, especially in light of her recent COVID-19 infection.  # Elevated troponin: Suspect this is demand ischemia.  She has no ischemic symptoms and her post EKG was unremarkable.  She was in SVT at rates in the 200s for several minutes.  I suspect this is the cause of her elevated troponin.  As above we will get an echo to make sure there is no evidence of myocarditis.  No plan for ischemic evaluation at this time.  No coronary calcifications noted on her chest CT-a and it was also negative for pulmonary embolism.      For questions or updates, please contact [facility] Please consult [email or web address] for contact info under    Signed, Chilton Si, MD  12/26/2020 2:21 PM

## 2020-12-26 NOTE — ED Notes (Signed)
Pt to CT

## 2020-12-27 ENCOUNTER — Observation Stay (HOSPITAL_BASED_OUTPATIENT_CLINIC_OR_DEPARTMENT_OTHER)
Admit: 2020-12-27 | Discharge: 2020-12-27 | Disposition: A | Discharge status: Home / Self Care | Payer: Managed Care, Other (non HMO) | Attending: Internal Medicine | Admitting: Internal Medicine

## 2020-12-27 DIAGNOSIS — R55 Syncope and collapse: Secondary | ICD-10-CM

## 2020-12-27 DIAGNOSIS — I471 Supraventricular tachycardia: Secondary | ICD-10-CM | POA: Diagnosis not present

## 2020-12-27 DIAGNOSIS — U071 COVID-19: Secondary | ICD-10-CM

## 2020-12-27 DIAGNOSIS — R778 Other specified abnormalities of plasma proteins: Secondary | ICD-10-CM | POA: Diagnosis not present

## 2020-12-27 LAB — URINE DRUG SCREEN, QUALITATIVE (ARMC ONLY)
Amphetamines, Ur Screen: NOT DETECTED
Barbiturates, Ur Screen: NOT DETECTED
Benzodiazepine, Ur Scrn: NOT DETECTED
Cannabinoid 50 Ng, Ur ~~LOC~~: NOT DETECTED
Cocaine Metabolite,Ur ~~LOC~~: NOT DETECTED
MDMA (Ecstasy)Ur Screen: NOT DETECTED
Methadone Scn, Ur: NOT DETECTED
Opiate, Ur Screen: NOT DETECTED
Phencyclidine (PCP) Ur S: NOT DETECTED
Tricyclic, Ur Screen: NOT DETECTED

## 2020-12-27 LAB — BASIC METABOLIC PANEL
Anion gap: 7 (ref 5–15)
BUN: 14 mg/dL (ref 6–20)
CO2: 20 mmol/L — ABNORMAL LOW (ref 22–32)
Calcium: 8 mg/dL — ABNORMAL LOW (ref 8.9–10.3)
Chloride: 108 mmol/L (ref 98–111)
Creatinine, Ser: 0.98 mg/dL (ref 0.44–1.00)
GFR, Estimated: >60 mL/min (ref >60)
Glucose, Bld: 108 mg/dL — ABNORMAL HIGH (ref 70–99)
Potassium: 4.3 mmol/L (ref 3.5–5.1)
Sodium: 135 mmol/L (ref 135–145)

## 2020-12-27 LAB — CBC
HCT: 35.4 % — ABNORMAL LOW (ref 36.0–46.0)
Hemoglobin: 12.2 g/dL (ref 12.0–15.0)
MCH: 30.1 pg (ref 26.0–34.0)
MCHC: 34.5 g/dL (ref 30.0–36.0)
MCV: 87.4 fL (ref 80.0–100.0)
Platelets: 221 10*3/uL (ref 150–400)
RBC: 4.05 MIL/uL (ref 3.87–5.11)
RDW: 13.5 % (ref 11.5–15.5)
WBC: 6.1 10*3/uL (ref 4.0–10.5)
nRBC: 0 % (ref 0.0–0.2)

## 2020-12-27 LAB — ECHOCARDIOGRAM COMPLETE
Height: 71 in
S' Lateral: 2.96 cm
Weight: 5206.38 oz

## 2020-12-27 LAB — LIPID PANEL
Cholesterol: 137 mg/dL (ref 0–200)
HDL: 38 mg/dL — ABNORMAL LOW (ref >40)
LDL Cholesterol: 85 mg/dL (ref 0–99)
Total CHOL/HDL Ratio: 3.6 RATIO
Triglycerides: 72 mg/dL (ref <150)
VLDL: 14 mg/dL (ref 0–40)

## 2020-12-27 LAB — HEMOGLOBIN A1C
Hgb A1c MFr Bld: 5.5 % (ref 4.8–5.6)
Mean Plasma Glucose: 111.15 mg/dL

## 2020-12-27 LAB — HIV ANTIBODY (ROUTINE TESTING W REFLEX): HIV Screen 4th Generation wRfx: NONREACTIVE

## 2020-12-27 LAB — MAGNESIUM: Magnesium: 2.1 mg/dL (ref 1.7–2.4)

## 2020-12-27 LAB — SARS CORONAVIRUS 2 (TAT 6-24 HRS): SARS Coronavirus 2: POSITIVE — AB

## 2020-12-27 LAB — TROPONIN I (HIGH SENSITIVITY): Troponin I (High Sensitivity): 25 ng/L — ABNORMAL HIGH (ref <18)

## 2020-12-27 NOTE — Discharge Summary (Signed)
Physician Discharge Summary  Patient ID: Krista Rice MRN: [id number] DOB/AGE: 34-Jul-1987 34 y.o.  Admit date: 12/26/2020 Discharge date: 12/27/2020  Admission Diagnoses:  Discharge Diagnoses:  Principal Problem:   Syncope Active Problems:   SVT (supraventricular tachycardia) (HCC)   Elevated troponin   COVID-19 virus infection   Discharged Condition: good  Hospital Course:  Krista Rice is a 35 y.o. female with medical history significant of self-reported COVID-19 infection on 12/10/20, who presents with syncope and palpitation. Patient had a PSVT while in the ambulance, received 6 mg adenosine, followed by 12 mg of adenosine x2, then converted to sinus.  After arriving the hospital, her troponin was 59, she also had elevated D-dimer, but a CT angiogram did not show any PE.  She has been seen by cardiology, her condition had improved. Her syncope appears to be secondary to PSVT and some dehydration. At this point, she will be followed by cardiology as outpatient.  We will also ask social worker to arrange for PCP.  She recently had a Covid infection, repeat test still positive for Covid.  Probably she is no longer infectious.  She has no respiratory symptoms.  Consults: cardiology  Significant Diagnostic Studies:  Echo: 1. Left ventricular ejection fraction, by estimation, is 60 to 65%. The left ventricle has normal function. The left ventricle has no regional wall motion abnormalities. Left ventricular diastolic function could not be evaluated. 2. Right ventricular systolic function is normal. The right ventricular size is normal. Tricuspid regurgitation signal is inadequate for assessing PA pressure. 3. The mitral valve is normal in structure. No evidence of mitral valve regurgitation. No evidence of mitral stenosis. 4. The aortic valve is normal in structure. Aortic valve regurgitation is not visualized. No aortic stenosis is present.  BILATERAL LOWER EXTREMITY VENOUS  DOPPLER ULTRASOUND  TECHNIQUE: Gray-scale sonography with compression, as well as color and duplex ultrasound, were performed to evaluate the deep venous system(s) from the level of the common femoral vein through the popliteal and proximal calf veins.  COMPARISON:  None.  FINDINGS: VENOUS  Normal compressibility of the common femoral, superficial femoral, and popliteal veins, as well as the visualized calf veins. Visualized portions of profunda femoral vein and great saphenous vein unremarkable. No filling defects to suggest DVT on grayscale or color Doppler imaging. Doppler waveforms show normal direction of venous flow, normal respiratory plasticity and response to augmentation.  Limited views of the contralateral common femoral vein are unremarkable.  OTHER  None.  Limitations: none  IMPRESSION: Negative.  No evidence of DVT within either lower extremity.   Electronically Signed   By: Bary Richard M.D.   On: 12/26/2020 15:03  CT ANGIOGRAPHY CHEST WITH CONTRAST  TECHNIQUE: Multidetector CT imaging of the chest was performed using the standard protocol during bolus administration of intravenous contrast. Multiplanar CT image reconstructions and MIPs were obtained to evaluate the vascular anatomy.  CONTRAST:  OMNIPAQUE IOHEXOL 350 MG/ML SOLN  COMPARISON:  None.  FINDINGS: Cardiovascular: Satisfactory opacification of the pulmonary arteries to the segmental level. No evidence of pulmonary embolism. Normal heart size. No pericardial effusion.  Mediastinum/Nodes: Unremarkable CT appearance of the thyroid gland. No suspicious mediastinal or hilar adenopathy. No soft tissue mediastinal mass. Small hiatal hernia.  Lungs/Pleura: Lungs are clear. No pleural effusion or pneumothorax.  Upper Abdomen: No acute abnormality.  Musculoskeletal: No chest wall abnormality. No acute or significant osseous findings.  Review of the MIP images  confirms the above findings.  IMPRESSION: Negative for acute  pulmonary embolus, pneumonia or other acute cardiopulmonary process.  Small hiatal hernia.   Electronically Signed   By: Malachy Moan M.D.   On: 12/26/2020 11:40  LUMBAR SPINE - 2-3 VIEW  COMPARISON:  None.  FINDINGS: There is no evidence of lumbar spine fracture. Alignment is within normal limits, perhaps minimal levoscoliosis which may be related to patient positioning. Intervertebral disc spaces are maintained. Visualized paravertebral soft tissues are unremarkable.  IMPRESSION: Negative.   Electronically Signed   By: Bary Richard M.D.   On: 12/26/2020 11:22  CT HEAD WITHOUT CONTRAST  TECHNIQUE: Contiguous axial images were obtained from the base of the skull through the vertex without intravenous contrast.  COMPARISON:  None.  FINDINGS: Brain: No evidence of acute infarction, hemorrhage, hydrocephalus, extra-axial collection or mass lesion/mass effect.  Vascular: No hyperdense vessel or unexpected calcification.  Skull: Normal. Negative for fracture or focal lesion.  Sinuses/Orbits: No acute finding.  IMPRESSION: Negative head CT.   Electronically Signed   By: Marnee Spring M.D.   On: 12/26/2020 09:19  Treatments: Cardiac monitor.  Discharge Exam: Blood pressure 136/79, pulse 90, temperature 97.8 F (36.6 C), temperature source Oral, resp. rate 20, height 5\' 11"  (1.803 m), weight (!) 147.6 kg, SpO2 100 %. General appearance: alert and cooperative Resp: clear to auscultation bilaterally Cardio: regular rate and rhythm, S1, S2 normal, no murmur, click, rub or gallop GI: soft, non-tender; bowel sounds normal; no masses,  no organomegaly Extremities: extremities normal, atraumatic, no cyanosis or edema  Disposition: Discharge disposition: 01-Home or Self Care       Discharge Instructions    Diet - low sodium heart healthy   Complete by: As directed     Increase activity slowly   Complete by: As directed      Allergies as of 12/27/2020   No Known Allergies     Medication List    TAKE these medications   etonogestrel 68 MG Impl implant Commonly known as: NEXPLANON 1 each by Subdermal route once.   guaiFENesin 600 MG 12 hr tablet Commonly known as: MUCINEX Take 600 mg by mouth daily as needed for cough.   NYQUIL COLD & FLU PO Take 5 mLs by mouth every 6 (six) hours as needed (cough).       Follow-up Information    12/29/2020, MD Follow up in 2 week(s).   Specialty: Cardiology Contact information: [address] [place] [address] [place] [id number]               Signed: [phone] 12/27/2020, 1:51 PM

## 2020-12-27 NOTE — Progress Notes (Signed)
*  PRELIMINARY RESULTS* Echocardiogram 2D Echocardiogram has been performed.  Krista Rice 12/27/2020, 1:03 PM

## 2021-01-13 ENCOUNTER — Encounter: Payer: Self-pay | Admitting: Cardiology

## 2021-01-13 ENCOUNTER — Ambulatory Visit (INDEPENDENT_AMBULATORY_CARE_PROVIDER_SITE_OTHER): Payer: Self-pay | Admitting: Cardiology

## 2021-01-13 ENCOUNTER — Other Ambulatory Visit: Payer: Self-pay

## 2021-01-13 DIAGNOSIS — I471 Supraventricular tachycardia: Secondary | ICD-10-CM

## 2021-01-13 DIAGNOSIS — U071 COVID-19: Secondary | ICD-10-CM

## 2021-01-13 NOTE — Assessment & Plan Note (Signed)
BMI 45- I would avoid phentermine she has taken this in the past for weight loss.

## 2021-01-13 NOTE — Assessment & Plan Note (Signed)
COVID (OP) 12/10/2020

## 2021-01-13 NOTE — Progress Notes (Signed)
Cardiology Office Note:    Date:  01/13/2021   ID:  Krista Rice, DOB 1986/02/20, MRN [id number]  PCP:  Patient, No Pcp Per  Cardiologist:  Dr Duke Salvia Electrophysiologist:  None   Referring MD: No ref. provider found   No chief complaint on file. post hospital follow up  History of Present Illness:    Krista Rice is a pleasant 35 y.o. female who had a syncopal spell 12/26/2020.  The patient had previously been diagnosed with 12/10/2020.  She was working overtime secondary to the weather.  She says she has been drinking monster energy drinks as well as some alcohol.  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  She had a syncopal spell.  When EMS arrived they documented PSVT.  She broke to sinus rhythm sinus tachycardia after the second dose of adenosine.  She was admitted overnight.  She was seen in consult by cardiology.  Echocardiogram was done which showed no significant abnormalities.  She is seen in the office today for follow-up.  Since discharge she has done well.  She is not had any recurrent episodes.  She has stopped caffeine and alcohol use.  No past medical history on file.  Past Surgical History:  Procedure Laterality Date  . CESAREAN SECTION      Current Medications: Current Meds  Medication Sig  . etonogestrel (NEXPLANON) 68 MG IMPL implant 1 each by Subdermal route once.  . [DISCONTINUED] DM-Doxylamine-Acetaminophen (NYQUIL COLD & FLU PO) Take 5 mLs by mouth every 6 (six) hours as needed (cough).  . [DISCONTINUED] guaiFENesin (MUCINEX) 600 MG 12 hr tablet Take 600 mg by mouth daily as needed for cough.     Allergies:   Patient has no known allergies.   Social History   Socioeconomic History  . Marital status: Single    Spouse name: Not on file  . Number of children: Not on file  . Years of education: Not on file  . Highest education level: Not on file  Occupational History  . Not on file  Tobacco Use  . Smoking status: Never  Smoker  . Smokeless tobacco: Never Used  Vaping Use  . Vaping Use: Never used  Substance and Sexual Activity  . Alcohol use: Yes    Comment: socially  . Drug use: Never  . Sexual activity: Yes    Birth control/protection: Implant, Condom  Other Topics Concern  . Not on file  Social History Narrative  . Not on file   Social Determinants of Health   Financial Resource Strain: Not on file  Food Insecurity: Not on file  Transportation Needs: Not on file  Physical Activity: Not on file  Stress: Not on file  Social Connections: Not on file     Family History: The patient's family history includes Breast cancer in her maternal aunt; Diabetes in her mother; Hypertension in her father and mother. There is no history of Ovarian cancer or Colon cancer.  ROS:   Please see the history of present illness.     All other systems reviewed and are negative.  EKGs/Labs/Other Studies Reviewed:    The following studies were reviewed today: Echo 12/27/2020- IMPRESSIONS    1. Left ventricular ejection fraction, by estimation, is 60 to 65%. The  left ventricle has normal function. The left ventricle has no regional  wall motion abnormalities. Left ventricular diastolic function could not  be evaluated.  2. Right ventricular systolic function is normal. The right ventricular  size is normal. Tricuspid regurgitation signal is inadequate for assessing  PA pressure.  3. The mitral valve is normal in structure. No evidence of mitral valve  regurgitation. No evidence of mitral stenosis.  4. The aortic valve is normal in structure. Aortic valve regurgitation is  not visualized. No aortic stenosis is   EKG:  EKG is not ordered today.  The ekg ordered 12/26/2020 demonstrates NSR, ST 100  Recent Labs: 12/26/2020: ALT 32; TSH 0.855 12/27/2020: BUN 14; Creatinine, Ser 0.98; Hemoglobin 12.2; Magnesium 2.1; Platelets 221; Potassium 4.3; Sodium 135  Recent Lipid Panel    Component Value Date/Time    CHOL 137 12/27/2020 0445   CHOL 172 06/27/2019 0000   TRIG 72 12/27/2020 0445   HDL 38 (L) 12/27/2020 0445   HDL 44 06/27/2019 0000   CHOLHDL 3.6 12/27/2020 0445   VLDL 14 12/27/2020 0445   LDLCALC 85 12/27/2020 0445   LDLCALC 119 (H) 06/27/2019 0000    Physical Exam:    VS:  BP 129/83   Pulse 84   Ht 5\' 11"  (1.803 m)   Wt (!) 324 lb (147 kg)   SpO2 100%   BMI 45.19 kg/m     Wt Readings from Last 3 Encounters:  01/13/21 (!) 324 lb (147 kg)  12/26/20 (!) 325 lb 6.4 oz (147.6 kg)  09/02/20 (!) 325 lb 4.8 oz (147.6 kg)     GEN: Obese female well developed in no acute distress HEENT: Normal NECK: No JVD CARDIAC: RRR, no murmurs, rubs, gallops RESPIRATORY:  Clear to auscultation without rales, wheezing or rhonchi  ABDOMEN: Soft,  non-distended MUSCULOSKELETAL:  No edema; No deformity  SKIN: Warm and dry NEUROLOGIC:  Alert and oriented x 3 PSYCHIATRIC:  Normal affect   ASSESSMENT:    SVT (supraventricular tachycardia) (HCC) Broke in ED after second dose of Adenosine. The episode could be attributed to multiple factors including recent CO VID infection, Monster energy drink use, over-the-counter decongestant use, and alcohol.  COVID-19 virus infection COVID (OP) 12/10/2020  Morbid obesity (HCC) BMI 45- I would avoid phentermine she has taken this in the past for weight loss.   PLAN:    PRN follow up.   Medication Adjustments/Labs and Tests Ordered: Current medicines are reviewed at length with the patient today.  Concerns regarding medicines are outlined above.  No orders of the defined types were placed in this encounter.  No orders of the defined types were placed in this encounter.   Patient Instructions  Medication Instructions:  Continue current medications  *If you need a refill on your cardiac medications before your next appointment, please call your pharmacy*   Lab Work: None Ordered   Testing/Procedures: None Ordered   Follow-Up: At  12/12/2020, you and your health needs are our priority.  As part of our continuing mission to provide you with exceptional heart care, we have created designated Provider Care Teams.  These Care Teams include your primary Cardiologist (physician) and Advanced Practice Providers (APPs -  Physician Assistants and Nurse Practitioners) who all work together to provide you with the care you need, when you need it.  We recommend signing up for the patient portal called "MyChart".  Sign up information is provided on this After Visit Summary.  MyChart is used to connect with patients for Virtual Visits (Telemedicine).  Patients are able to view lab/test results, encounter notes, upcoming appointments, etc.  Non-urgent messages can be sent to your provider as well.   To learn more about  what you can do with MyChart, go to [email or web address].    Your next appointment:   As Needed     Signed, Corine Shelter, PA-C  01/13/2021 12:06 PM    [facility]

## 2021-01-13 NOTE — Assessment & Plan Note (Addendum)
Broke in ED after second dose of Adenosine. The episode could be attributed to multiple factors including recent CO VID infection, Monster energy drink use, over-the-counter decongestant use, and alcohol.

## 2021-01-13 NOTE — Patient Instructions (Signed)
Medication Instructions:  Continue current medications  *If you need a refill on your cardiac medications before your next appointment, please call your pharmacy*   Lab Work: None Ordered   Testing/Procedures: None Ordered   Follow-Up: At [facility], you and your health needs are our priority.  As part of our continuing mission to provide you with exceptional heart care, we have created designated Provider Care Teams.  These Care Teams include your primary Cardiologist (physician) and Advanced Practice Providers (APPs -  Physician Assistants and Nurse Practitioners) who all work together to provide you with the care you need, when you need it.  We recommend signing up for the patient portal called "MyChart".  Sign up information is provided on this After Visit Summary.  MyChart is used to connect with patients for Virtual Visits (Telemedicine).  Patients are able to view lab/test results, encounter notes, upcoming appointments, etc.  Non-urgent messages can be sent to your provider as well.   To learn more about what you can do with MyChart, go to [email or web address].    Your next appointment:   As Needed  

## 2022-03-27 ENCOUNTER — Ambulatory Visit (LOCAL_COMMUNITY_HEALTH_CENTER): Payer: Self-pay

## 2022-03-27 DIAGNOSIS — Z111 Encounter for screening for respiratory tuberculosis: Secondary | ICD-10-CM

## 2022-03-27 NOTE — Progress Notes (Signed)
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  States had a ? Positive skin test in the past (maybe between 2014-2018) and had CXR and told everything ok;no medication.  Also states she was never told not to receive future skin test.  No record of this found in pts record. ? ?Skin test administered and pt. will return on  ?03-30-22 for reading.  Cherlynn Polo, RN  ?

## 2022-03-30 ENCOUNTER — Ambulatory Visit (LOCAL_COMMUNITY_HEALTH_CENTER): Payer: Self-pay

## 2022-03-30 DIAGNOSIS — Z111 Encounter for screening for respiratory tuberculosis: Secondary | ICD-10-CM

## 2022-03-30 LAB — TB SKIN TEST
Induration: 6 mm
TB Skin Test: NEGATIVE
